# Patient Record
Sex: Female | Born: 1935 | Race: White | Hispanic: No | Marital: Married | Smoking: Never smoker
Health system: Southern US, Community
[De-identification: ages and names within clinical notes are randomized; demographics above are authoritative.]

## PROBLEM LIST (undated history)

## (undated) DIAGNOSIS — M47812 Spondylosis without myelopathy or radiculopathy, cervical region: Secondary | ICD-10-CM

## (undated) DIAGNOSIS — I351 Nonrheumatic aortic (valve) insufficiency: Secondary | ICD-10-CM

## (undated) DIAGNOSIS — E559 Vitamin D deficiency, unspecified: Secondary | ICD-10-CM

## (undated) DIAGNOSIS — M8580 Other specified disorders of bone density and structure, unspecified site: Secondary | ICD-10-CM

## (undated) DIAGNOSIS — I371 Nonrheumatic pulmonary valve insufficiency: Secondary | ICD-10-CM

## (undated) DIAGNOSIS — I5189 Other ill-defined heart diseases: Secondary | ICD-10-CM

## (undated) DIAGNOSIS — I00 Rheumatic fever without heart involvement: Secondary | ICD-10-CM

## (undated) DIAGNOSIS — I370 Nonrheumatic pulmonary valve stenosis: Secondary | ICD-10-CM

## (undated) HISTORY — DX: Spondylosis without myelopathy or radiculopathy, cervical region: M47.812

## (undated) HISTORY — DX: Other specified disorders of bone density and structure, unspecified site: M85.80

## (undated) HISTORY — DX: Other ill-defined heart diseases: I51.89

## (undated) HISTORY — DX: Nonrheumatic aortic (valve) insufficiency: I35.1

## (undated) HISTORY — DX: Vitamin D deficiency, unspecified: E55.9

## (undated) HISTORY — DX: Nonrheumatic pulmonary valve insufficiency: I37.1

## (undated) HISTORY — DX: Nonrheumatic pulmonary valve stenosis: I37.0

## (undated) HISTORY — PX: BREAST EXCISIONAL BIOPSY: SUR124

## (undated) HISTORY — DX: Rheumatic fever without heart involvement: I00

## (undated) HISTORY — PX: PARTIAL HYSTERECTOMY: SHX80

## (undated) HISTORY — PX: BREAST BIOPSY: SHX20

---

## 1998-11-05 ENCOUNTER — Encounter: Payer: Self-pay | Admitting: Endocrinology

## 1998-11-05 ENCOUNTER — Ambulatory Visit (HOSPITAL_COMMUNITY): Admission: RE | Admit: 1998-11-05 | Discharge: 1998-11-05 | Payer: Self-pay | Admitting: Endocrinology

## 1999-02-23 ENCOUNTER — Ambulatory Visit (HOSPITAL_COMMUNITY): Admission: RE | Admit: 1999-02-23 | Discharge: 1999-02-23 | Payer: Self-pay | Admitting: Endocrinology

## 2000-01-12 ENCOUNTER — Other Ambulatory Visit: Admission: RE | Admit: 2000-01-12 | Discharge: 2000-01-12 | Payer: Self-pay | Admitting: Family Medicine

## 2000-03-26 ENCOUNTER — Encounter: Admission: RE | Admit: 2000-03-26 | Discharge: 2000-03-26 | Payer: Self-pay | Admitting: Family Medicine

## 2000-03-26 ENCOUNTER — Encounter: Payer: Self-pay | Admitting: Family Medicine

## 2000-04-03 ENCOUNTER — Encounter: Admission: RE | Admit: 2000-04-03 | Discharge: 2000-04-03 | Payer: Self-pay | Admitting: Family Medicine

## 2000-04-03 ENCOUNTER — Encounter: Payer: Self-pay | Admitting: Family Medicine

## 2001-07-03 ENCOUNTER — Encounter: Admission: RE | Admit: 2001-07-03 | Discharge: 2001-07-03 | Payer: Self-pay | Admitting: Family Medicine

## 2001-07-03 ENCOUNTER — Encounter: Payer: Self-pay | Admitting: Family Medicine

## 2003-02-23 ENCOUNTER — Encounter: Payer: Self-pay | Admitting: Emergency Medicine

## 2003-02-23 ENCOUNTER — Observation Stay (HOSPITAL_COMMUNITY): Admission: EM | Admit: 2003-02-23 | Discharge: 2003-02-23 | Payer: Self-pay | Admitting: Emergency Medicine

## 2003-04-16 ENCOUNTER — Encounter: Admission: RE | Admit: 2003-04-16 | Discharge: 2003-04-16 | Payer: Self-pay | Admitting: Family Medicine

## 2003-04-16 ENCOUNTER — Encounter: Payer: Self-pay | Admitting: Family Medicine

## 2003-05-08 ENCOUNTER — Ambulatory Visit (HOSPITAL_COMMUNITY): Admission: RE | Admit: 2003-05-08 | Discharge: 2003-05-08 | Payer: Self-pay | Admitting: Gastroenterology

## 2003-05-08 ENCOUNTER — Encounter (INDEPENDENT_AMBULATORY_CARE_PROVIDER_SITE_OTHER): Payer: Self-pay | Admitting: Specialist

## 2003-11-02 ENCOUNTER — Ambulatory Visit (HOSPITAL_COMMUNITY): Admission: RE | Admit: 2003-11-02 | Discharge: 2003-11-02 | Payer: Self-pay | Admitting: Gastroenterology

## 2003-11-02 ENCOUNTER — Encounter (INDEPENDENT_AMBULATORY_CARE_PROVIDER_SITE_OTHER): Payer: Self-pay | Admitting: *Deleted

## 2003-11-16 ENCOUNTER — Encounter: Admission: RE | Admit: 2003-11-16 | Discharge: 2003-11-16 | Payer: Self-pay | Admitting: Gastroenterology

## 2004-04-21 ENCOUNTER — Encounter: Admission: RE | Admit: 2004-04-21 | Discharge: 2004-04-21 | Payer: Self-pay | Admitting: Family Medicine

## 2004-07-06 ENCOUNTER — Ambulatory Visit (HOSPITAL_COMMUNITY): Admission: RE | Admit: 2004-07-06 | Discharge: 2004-07-06 | Payer: Self-pay | Admitting: Gastroenterology

## 2004-07-06 ENCOUNTER — Encounter (INDEPENDENT_AMBULATORY_CARE_PROVIDER_SITE_OTHER): Payer: Self-pay | Admitting: *Deleted

## 2005-05-26 ENCOUNTER — Encounter: Admission: RE | Admit: 2005-05-26 | Discharge: 2005-05-26 | Payer: Self-pay | Admitting: Family Medicine

## 2006-06-13 ENCOUNTER — Encounter: Payer: Self-pay | Admitting: Vascular Surgery

## 2006-06-13 ENCOUNTER — Ambulatory Visit (HOSPITAL_COMMUNITY): Admission: RE | Admit: 2006-06-13 | Discharge: 2006-06-13 | Payer: Self-pay | Admitting: Cardiology

## 2006-06-29 ENCOUNTER — Encounter: Admission: RE | Admit: 2006-06-29 | Discharge: 2006-06-29 | Payer: Self-pay | Admitting: Family Medicine

## 2007-07-09 ENCOUNTER — Encounter: Admission: RE | Admit: 2007-07-09 | Discharge: 2007-07-09 | Payer: Self-pay | Admitting: Family Medicine

## 2007-11-28 HISTORY — PX: LAPAROSCOPIC CHOLECYSTECTOMY: SUR755

## 2008-05-14 ENCOUNTER — Encounter: Admission: RE | Admit: 2008-05-14 | Discharge: 2008-05-14 | Payer: Self-pay | Admitting: Family Medicine

## 2008-07-10 ENCOUNTER — Encounter: Admission: RE | Admit: 2008-07-10 | Discharge: 2008-07-10 | Payer: Self-pay | Admitting: Family Medicine

## 2008-09-17 ENCOUNTER — Ambulatory Visit (HOSPITAL_COMMUNITY): Admission: RE | Admit: 2008-09-17 | Discharge: 2008-09-19 | Payer: Self-pay | Admitting: Surgery

## 2008-09-17 ENCOUNTER — Encounter (INDEPENDENT_AMBULATORY_CARE_PROVIDER_SITE_OTHER): Payer: Self-pay | Admitting: Surgery

## 2008-10-16 ENCOUNTER — Encounter: Admission: RE | Admit: 2008-10-16 | Discharge: 2008-10-16 | Payer: Self-pay | Admitting: Family Medicine

## 2010-09-16 ENCOUNTER — Encounter: Admission: RE | Admit: 2010-09-16 | Discharge: 2010-09-16 | Payer: Self-pay | Admitting: Family Medicine

## 2011-04-11 NOTE — Op Note (Signed)
NAME:  Carrie Spencer, Carrie Spencer NO.:  [id number]   MEDICAL RECORD NO.:  [id number]          PATIENT TYPE:  INP   LOCATION:  0004                         FACILITY:  [facility]   PHYSICIAN:  Wilmon Arms. Corliss Skains, M.D. DATE OF BIRTH:  October 10, 1934   DATE OF PROCEDURE:  09/17/2008  DATE OF DISCHARGE:                               OPERATIVE REPORT   PREOPERATIVE DIAGNOSIS:  Symptomatic cholelithiasis.   POSTOPERATIVE DIAGNOSIS:  Symptomatic cholelithiasis.   PROCEDURE PERFORMED:  Laparoscopic cholecystectomy with intraoperative  cholangiogram.   SURGEON:  Wilmon Arms. Corliss Skains, M.D., FACS   ASSISTANT:  Velora Heckler, MD.   ANESTHESIA:  General endotracheal.   INDICATIONS:  The patient is a healthy 77 year old female who presents  with a 45-month history of intermittent right upper quadrant abdominal  pain with some bloating.  A CT scan obtained by her primary care  physician showed a moderate-size hiatal hernia with signs of  cholelithiasis but no cholecystitis.  She was seen in consultation.  We  recommended elective cholecystectomy.  The patient presents today for  cholecystectomy.   DESCRIPTION OF PROCEDURE:  The patient was brought to the operating  room, placed in a supine position on the operating room table.  After an  adequate level of general anesthesia was obtained, the patient's abdomen  prepped with Betadine and draped in a sterile fashion.  She had a  preexisting lower midline incision.  We opened the top end of her  incision for about a centimeter.  Dissection was carried down to the  fascia, which was opened vertically.  We entered the peritoneal cavity  bluntly.  A stay suture of 0 Vicryl was placed around the fascial  opening.  Pneumoperitoneum was  obtained by insufflating CO2,  maintaining a maximal pressure of 15 mmHg.  The laparoscope was  inserted.  The patient was positioned in reverse Trendelenburg and  tilted to her left.  The patient had a very large  nondistended  gallbladder.  An 11-mm port was placed in the subxiphoid position.  Two  5-mm ports were placed in the right upper quadrant.  The gallbladder was  grasped with a clamp and elevated.  There were adhesions to the surface  of the gallbladder.  These were taken down bluntly.  We opened the  peritoneum around the hilum of the gallbladder.  We identified 2 tubular  structures entering the gallbladder.  The more anterior structure was  ligated with clips and divided.  This represented the cystic artery.  The cystic duct was ligated with a clip and a small opening was created  on the cystic duct.  A Cook cholangiogram catheter was inserted through  the stab incision and threaded into the cystic duct.  A cholangiogram  was obtained, which showed an adequate length of cystic duct.  Contrast  flowed easily into the biliary tree with no sign of filling defects or  obstruction.  Contrast flowed easily into the duodenum.  The catheter  was removed and the cystic duct ligated with clips and divided.  Cautery  was then used to remove the gallbladder from the liver.  The gallbladder  was irrigated and hemostasis was good.  We placed the gallbladder into  an EndoCatch sack and removed it through the umbilical port site.  We  reinspected the right upper quadrant and hemostasis was good.  Pneumoperitoneum was then released as we removed the ports under direct  vision.  The pursestring suture was used to close the umbilical fascia  and 4-0 Monocryl was used to close the skin incisions.  Steri-Strips and  clean dressings were applied.  The patient was then extubated and  brought to recovery in stable condition.  All sponge, instrument, and  needle counts were correct.      Wilmon Arms. Tsuei, M.D.  Electronically Signed     MKT/MEDQ  D:  09/17/2008  T:  09/18/2008  Job:  [id number]   cc:   Clarene Duke, MD

## 2011-04-14 NOTE — Op Note (Signed)
NAME:  Carrie Spencer, Carrie Spencer                        ACCOUNT NO.:  [id number]   MEDICAL RECORD NO.:  [id number]                   PATIENT TYPE:  AMB   LOCATION:  ENDO                                 FACILITY:  [facility]   PHYSICIAN:  Anselmo Rod, M.D.               DATE OF BIRTH:  1934-08-05   DATE OF PROCEDURE:  07/06/2004  DATE OF DISCHARGE:                                 OPERATIVE REPORT   PROCEDURE PERFORMED:  Flexible sigmoidoscopy with multiple biopsies.   ENDOSCOPIST:  Anselmo Rod, M.D.   INSTRUMENT USED:  Olympus video colonoscope.   INDICATIONS FOR PROCEDURE:  The patient is a 77 year old white female with  abnormality noted on the rectosigmoid colon in December 2004 diagnosed to be  mucosal prolapse undergoing a repeat evaluation of that area by flexible  sigmoidoscopy for possible repeat biopsy to rule out dysplasia versus  malignancy.   PREPROCEDURE PREPARATION:  Informed consent was procured from the patient.  The patient was fasted for eight hours prior to the procedure and prepped  with a bottle of magnesium citrate and a gallon of GoLYTELY the night prior  to the procedure.   PREPROCEDURE PHYSICAL:  The patient had stable vital signs.  Neck supple.  Chest clear to auscultation.  S1, S2 regular. Abdomen soft with normal bowel  sounds.   DESCRIPTION OF PROCEDURE:  The patient was placed in left lateral decubitus  position and sedated with 75 mg of Demerol and 7.5 mg of Versed in slow  incremental doses.  Once the patient was adequately sedated and maintained  on low flow oxygen and continuous cardiac monitoring, the Olympus video  colonoscope was advanced from the rectum to 90 cm without difficulty.  There  was some residual stool in the colon.  Multiple washes were done.  Nodular  changes were noted again in the rectosigmoid colon from 10 to 20 cm.  These  were scattered with some areas of normal intervening mucosa.  There was  ulceration noted at about 15 cm.   Multiple biopsies were done to rule out  dysplasia versus malignancy.  Small internal hemorrhoids seen on  retroflexion in the rectum.  No other abnormalities noted after 90 cm.   RECOMMENDATIONS:  1. Await pathology results.  2. Follow high fiber diet with strict bowel regimen to prevent problems with     chronic constipation and further irritation of this area.  3. Outpatient followup in the next two weeks for further recommendations.                                               Anselmo Rod, M.D.    JNM/MEDQ  D:  07/06/2004  T:  07/06/2004  Job:  [id number]   cc:   Vikki Ports, M.D.  [phone]  [address]  [place]  [place] [address]  Fax: [phone]

## 2011-04-14 NOTE — Op Note (Signed)
NAME:  Carrie Spencer, Carrie Spencer                        ACCOUNT NO.:  [id number]   MEDICAL RECORD NO.:  [id number]                   PATIENT TYPE:  AMB   LOCATION:  ENDO                                 FACILITY:  [facility]   PHYSICIAN:  Anselmo Rod, M.D.               DATE OF BIRTH:  1933-12-05   DATE OF PROCEDURE:  05/08/2003  DATE OF DISCHARGE:  05/08/2003                                 OPERATIVE REPORT   PROCEDURE:  Colonoscopy with biopsies.   ENDOSCOPIST:  Anselmo Rod, M.D.   INSTRUMENT USED:  Olympus video colonoscope.   INDICATIONS FOR PROCEDURE:  Guaiac positive stools in a 77 year old white  female.  Rule out colonic polyps or masses.  The patient has a history of  abnormal folds in the rectum and rectosigmoid area noted on colonoscopy in  the past.   PRE-PROCEDURE PREPARATION:  Informed consent was procured from the patient.  The patient was fasted for eight hours prior to the procedure and prepped  with a bottle of magnesium citrate and a gallon of GoLYTELY the night prior  to the procedure.   PRE-PROCEDURE PHYSICAL:  VITAL SIGNS:  The patient had stable vital signs.  NECK:  Supple.  CHEST:  Clear to auscultation.  CARDIAC:  S1 and S2 are regular.  ABDOMEN:  Abdomen is soft with normal bowel sounds.   DESCRIPTION OF PROCEDURE:  The patient was placed in the left lateral  decubitus position and sedated with 50 mg of Demerol and 5 mg of Versed  intravenously.  Once the patient was adequately sedated and maintained on  low flow oxygen and continuous cardiac monitoring, the Olympus video  colonoscope was advanced from the rectum to the cecum.  There was a  significant amount of residual stool in the colon and multiple washings were  done.  Nodular changes were noted in the mucosa from 10 to 15 cm.  This was  biopsied and sent to pathology.  Four seemed to be soft to biopsy.  No  definite mass or polyps were seen. __________ changes were appreciated on a  colonoscopy in  the past.  No AVMs or masses were noted elsewhere.  The  appendiceal orifice and inferior vena cava were visualized and photographed.  There was evidence of significant melanosis colic in the right colon.  The  patient tolerated the procedure well without complications.  Small internal  and external hemorrhoids were seen on retroflexion and anal inspection,  respectively.   IMPRESSION:  1. Small non-bleeding internal and external hemorrhoids.  2. Nodular mucosa at 10 to 15 cm, biopsies done.  3. 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    RECOMMENDATIONS:  1. Await pathology results.  2. Avoid all non-steroidals for now.  3. The patient will follow-up in the next two weeks for further     recommendations.  Anselmo Rod, M.D.    JNM/MEDQ  D:  05/10/2003  T:  05/10/2003  Job:  [id number]   cc:   Vikki Ports, M.D.  [address]  [place]  [place] [address]  Fax: [phone]

## 2011-04-14 NOTE — Op Note (Signed)
NAME:  Carrie Spencer, Carrie Spencer                        ACCOUNT NO.:  [id number]   MEDICAL RECORD NO.:  [id number]                   PATIENT TYPE:  AMB   LOCATION:  ENDO                                 FACILITY:  [facility]   PHYSICIAN:  Anselmo Rod, M.D.               DATE OF BIRTH:  [birth year]   DATE OF PROCEDURE:  11/02/2003  DATE OF DISCHARGE:                                 OPERATIVE REPORT   PROCEDURE:  Flexible sigmoidoscopy with biopsies.   ENDOSCOPIST:  Anselmo Rod, M.D.   INSTRUMENT USED:  Olympus video colonoscope.   INDICATIONS FOR PROCEDURE:  A 77 year old white female  with a history of  abnormal mass in the rectosigmoid area, who is undergoing a repeat  flexible  sigmoidoscopy to reevaluate  this area. Biopsies showed no evidence of  adenoma or dysplasia.   PREPROCEDURE PREPARATION:  Informed consent was obtained from the patient  and the patient was  fasted for 8 hours prior to the procedure and prepped  with a bottle of  MiraLax the  night prior to the procedure.   PREPROCEDURE PHYSICAL:  The patient had stable vital signs. Neck supple.  Chest clear to auscultation, S1, S2 regular. Abdomen soft with normoactive  bowel sounds.   DESCRIPTION OF PROCEDURE:  The patient was placed in the left lateral  decubitus position and sedated with 40 mg of Demerol and 4 mg of Versed  intravenously. Once sedation was adequate, the patient was maintained on low  flow oxygen and continuous cardiac monitoring.   The Olympus video colonoscope was advanced from the rectum to 50 cm. Nodular  changes were seen in the rectosigmoid mucosa from 10 to 15 cm. The rectal  mucosa appeared healthy and so did the mucosa beyond 15 cm up to 50 cm.  Retroflexion in the rectum revealed small nonbleeding internal hemorrhoids.  Multiple biopsies were done from the rectosigmoid area. There was no  evidence of diverticulosis in the sigmoid colon.   IMPRESSION:  1. Nodular changes in the rectosigmoid  colon of unclear significance.     Biopsies done.  2. Small internal hemorrhoids.  3. Normal appearing rectal mucosa and normal appearing sigmoid colon from 16     to 50 cm.   RECOMMENDATIONS:  1. Await pathology results.  2. Outpatient followup in the next 4 weeks or earlier if need be.                                               Anselmo Rod, M.D.    JNM/MEDQ  D:  11/02/2003  T:  11/02/2003  Job:  [id number]   cc:   Vikki Ports, M.D.  [address]  [place]  [place] [address]  Fax: [phone]

## 2011-08-28 LAB — CBC
HCT: 35.1 — ABNORMAL LOW
Hemoglobin: 11.8 — ABNORMAL LOW
MCHC: 33.7
MCV: 89.8
Platelets: 209
RBC: 3.91
RDW: 14.8
WBC: 4.5

## 2011-08-28 LAB — DIFFERENTIAL
Basophils Absolute: 0
Basophils Relative: 0
Eosinophils Absolute: 0.1
Eosinophils Relative: 3
Lymphocytes Relative: 20
Lymphs Abs: 0.9
Monocytes Absolute: 0.3
Monocytes Relative: 8
Neutro Abs: 3.1
Neutrophils Relative %: 69

## 2011-08-28 LAB — COMPREHENSIVE METABOLIC PANEL
ALT: 13
AST: 16
Albumin: 3.4 — ABNORMAL LOW
Alkaline Phosphatase: 50
BUN: 7
CO2: 27
Calcium: 8.9
Chloride: 109
Creatinine, Ser: 1.02
GFR calc Af Amer: >60
GFR calc non Af Amer: 53 — ABNORMAL LOW
Glucose, Bld: 86
Potassium: 3.3 — ABNORMAL LOW
Sodium: 142
Total Bilirubin: 0.6
Total Protein: 5.8 — ABNORMAL LOW

## 2011-10-03 ENCOUNTER — Other Ambulatory Visit: Payer: Self-pay | Admitting: Family Medicine

## 2011-10-03 DIAGNOSIS — Z1231 Encounter for screening mammogram for malignant neoplasm of breast: Secondary | ICD-10-CM

## 2011-10-20 ENCOUNTER — Ambulatory Visit: Payer: Self-pay

## 2011-10-20 ENCOUNTER — Ambulatory Visit
Admission: RE | Admit: 2011-10-20 | Discharge: 2011-10-20 | Disposition: A | Discharge status: Home / Self Care | Payer: Medicare Other | Source: Ambulatory Visit | Attending: Family Medicine | Admitting: Family Medicine

## 2011-10-20 DIAGNOSIS — Z1231 Encounter for screening mammogram for malignant neoplasm of breast: Secondary | ICD-10-CM

## 2012-07-16 ENCOUNTER — Other Ambulatory Visit: Payer: Self-pay | Admitting: Dermatology

## 2012-11-13 ENCOUNTER — Other Ambulatory Visit: Payer: Self-pay | Admitting: Family Medicine

## 2012-11-13 DIAGNOSIS — M899 Disorder of bone, unspecified: Secondary | ICD-10-CM

## 2012-11-13 DIAGNOSIS — Z1231 Encounter for screening mammogram for malignant neoplasm of breast: Secondary | ICD-10-CM

## 2012-11-13 DIAGNOSIS — M949 Disorder of cartilage, unspecified: Secondary | ICD-10-CM

## 2012-12-20 ENCOUNTER — Ambulatory Visit
Admission: RE | Admit: 2012-12-20 | Discharge: 2012-12-20 | Disposition: A | Discharge status: Home / Self Care | Payer: Medicare Other | Source: Ambulatory Visit | Attending: Family Medicine | Admitting: Family Medicine

## 2012-12-20 DIAGNOSIS — Z1231 Encounter for screening mammogram for malignant neoplasm of breast: Secondary | ICD-10-CM

## 2012-12-20 DIAGNOSIS — M899 Disorder of bone, unspecified: Secondary | ICD-10-CM

## 2014-04-07 ENCOUNTER — Other Ambulatory Visit: Payer: Self-pay

## 2014-04-07 DIAGNOSIS — Z1231 Encounter for screening mammogram for malignant neoplasm of breast: Secondary | ICD-10-CM

## 2014-05-13 ENCOUNTER — Ambulatory Visit
Admission: RE | Admit: 2014-05-13 | Discharge: 2014-05-13 | Disposition: A | Discharge status: Home / Self Care | Payer: Medicare Other | Source: Ambulatory Visit

## 2014-05-13 DIAGNOSIS — Z1231 Encounter for screening mammogram for malignant neoplasm of breast: Secondary | ICD-10-CM

## 2014-12-29 ENCOUNTER — Encounter (HOSPITAL_COMMUNITY): Payer: Self-pay | Admitting: Emergency Medicine

## 2014-12-29 ENCOUNTER — Emergency Department (HOSPITAL_COMMUNITY)
Admission: EM | Admit: 2014-12-29 | Discharge: 2014-12-29 | Disposition: A | Discharge status: Home / Self Care | Payer: Medicare Other | Attending: Emergency Medicine | Admitting: Emergency Medicine

## 2014-12-29 DIAGNOSIS — E860 Dehydration: Secondary | ICD-10-CM | POA: Diagnosis present

## 2014-12-29 DIAGNOSIS — Z79899 Other long term (current) drug therapy: Secondary | ICD-10-CM | POA: Diagnosis not present

## 2014-12-29 DIAGNOSIS — M47812 Spondylosis without myelopathy or radiculopathy, cervical region: Secondary | ICD-10-CM | POA: Insufficient documentation

## 2014-12-29 DIAGNOSIS — Z8679 Personal history of other diseases of the circulatory system: Secondary | ICD-10-CM | POA: Insufficient documentation

## 2014-12-29 DIAGNOSIS — R000 Tachycardia, unspecified: Secondary | ICD-10-CM | POA: Diagnosis not present

## 2014-12-29 DIAGNOSIS — Z880 Allergy status to penicillin: Secondary | ICD-10-CM | POA: Insufficient documentation

## 2014-12-29 DIAGNOSIS — E559 Vitamin D deficiency, unspecified: Secondary | ICD-10-CM | POA: Insufficient documentation

## 2014-12-29 DIAGNOSIS — R011 Cardiac murmur, unspecified: Secondary | ICD-10-CM | POA: Diagnosis not present

## 2014-12-29 DIAGNOSIS — M8580 Other specified disorders of bone density and structure, unspecified site: Secondary | ICD-10-CM | POA: Insufficient documentation

## 2014-12-29 LAB — COMPREHENSIVE METABOLIC PANEL
ALT: 14 U/L (ref 0–35)
AST: 23 U/L (ref 0–37)
Albumin: 4.1 g/dL (ref 3.5–5.2)
Alkaline Phosphatase: 65 U/L (ref 39–117)
Anion gap: 9 (ref 5–15)
BUN: 19 mg/dL (ref 6–23)
CO2: 22 mmol/L (ref 19–32)
Calcium: 8.4 mg/dL (ref 8.4–10.5)
Chloride: 109 mmol/L (ref 96–112)
Creatinine, Ser: 0.91 mg/dL (ref 0.50–1.10)
GFR calc Af Amer: 68 mL/min — ABNORMAL LOW (ref >90)
GFR calc non Af Amer: 59 mL/min — ABNORMAL LOW (ref >90)
Glucose, Bld: 120 mg/dL — ABNORMAL HIGH (ref 70–99)
Potassium: 4 mmol/L (ref 3.5–5.1)
Sodium: 140 mmol/L (ref 135–145)
Total Bilirubin: 0.7 mg/dL (ref 0.3–1.2)
Total Protein: 7.2 g/dL (ref 6.0–8.3)

## 2014-12-29 LAB — CBC WITH DIFFERENTIAL/PLATELET
Basophils Absolute: 0 10*3/uL (ref 0.0–0.1)
Basophils Relative: 0 % (ref 0–1)
Eosinophils Absolute: 0 10*3/uL (ref 0.0–0.7)
Eosinophils Relative: 0 % (ref 0–5)
HCT: 43.2 % (ref 36.0–46.0)
Hemoglobin: 14.3 g/dL (ref 12.0–15.0)
Lymphocytes Relative: 9 % — ABNORMAL LOW (ref 12–46)
Lymphs Abs: 0.6 10*3/uL — ABNORMAL LOW (ref 0.7–4.0)
MCH: 29.6 pg (ref 26.0–34.0)
MCHC: 33.1 g/dL (ref 30.0–36.0)
MCV: 89.4 fL (ref 78.0–100.0)
Monocytes Absolute: 0.3 10*3/uL (ref 0.1–1.0)
Monocytes Relative: 4 % (ref 3–12)
Neutro Abs: 6.4 10*3/uL (ref 1.7–7.7)
Neutrophils Relative %: 87 % — ABNORMAL HIGH (ref 43–77)
Platelets: 234 10*3/uL (ref 150–400)
RBC: 4.83 MIL/uL (ref 3.87–5.11)
RDW: 14 % (ref 11.5–15.5)
WBC: 7.3 10*3/uL (ref 4.0–10.5)

## 2014-12-29 LAB — URINALYSIS, ROUTINE W REFLEX MICROSCOPIC
Glucose, UA: NEGATIVE mg/dL
Hgb urine dipstick: NEGATIVE
Ketones, ur: 15 mg/dL — AB
Leukocytes, UA: NEGATIVE
Nitrite: NEGATIVE
Protein, ur: NEGATIVE mg/dL
Specific Gravity, Urine: 1.024 (ref 1.005–1.030)
Urobilinogen, UA: 0.2 mg/dL (ref 0.0–1.0)
pH: 5.5 (ref 5.0–8.0)

## 2014-12-29 MED ORDER — SODIUM CHLORIDE 0.9 % IV SOLN
1000.0000 mL | Freq: Once | INTRAVENOUS | Status: AC
Start: 2014-12-29 — End: 2014-12-29
  Administered 2014-12-29: 1000 mL via INTRAVENOUS

## 2014-12-29 MED ORDER — ONDANSETRON 4 MG PO TBDP: ORAL_TABLET | ORAL | Status: DC

## 2014-12-29 MED ORDER — ONDANSETRON HCL 4 MG/2ML IJ SOLN
4.0000 mg | Freq: Once | INTRAMUSCULAR | Status: AC
  Administered 2014-12-29: 4 mg via INTRAVENOUS
  Filled 2014-12-29: qty 2

## 2014-12-29 MED ORDER — SODIUM CHLORIDE 0.9 % IV BOLUS (SEPSIS)
500.0000 mL | Freq: Once | INTRAVENOUS | Status: AC
  Administered 2014-12-29: 500 mL via INTRAVENOUS

## 2014-12-29 NOTE — ED Provider Notes (Signed)
Patient presented to the ER with dehydration. Patient reports nausea, vomiting, diarrhea since last night. She saw her doctor earlier felt she was dehydrated, sent to the ER for fluids. She is not experiencing any abdominal pain currently.  Face to face Exam: HEENT - PERRLA Lungs - CTAB Heart - Reg tachycardia, no M/R/G Abd - S/NT/ND Neuro - alert, oriented x3  Plan: Check basic labs, Mister IV fluids. If vital signs (heart rate) improve and labs are normal, appropriate for discharge. If significant dehydration, admitted to hospital for further hydration.   Orpah Greek, MD 12/29/14 1739

## 2014-12-29 NOTE — ED Notes (Signed)
Placed pt on bedpan in attempt to collect urine sample.

## 2014-12-29 NOTE — ED Provider Notes (Signed)
CSN: [id number]     Arrival date & time 12/29/14  1554 History   First MD Initiated Contact with Patient 12/29/14 1623     Chief Complaint  Patient presents with  . Dehydration  . Tachycardia     (Consider location/radiation/quality/duration/timing/severity/associated sxs/prior Treatment) HPI Carrie Spencer is a 81 y.o. female who presents from her primary care, [facility] and Dr. Jacelyn Grip for evaluation of tachycardia and dehydration. Patient reports last night at approximately 11 PM she began to feel nauseous and began to vomit at around 2:30 AM this morning. She also reports associated diarrhea with 6 or 7 episodes of loose watery stool, without blood. She also reports approximately 6 episodes of vomiting, mostly dry heaving, nonbilious nonbloody. She reports taking Pepto-Bismol without relief. She denies fevers at home, headaches, chest pain, shortness of breath, cough, leg swelling, orthopnea. No changes in vision, numbness or weakness or syncope.  Past Medical History  Diagnosis Date  . Rheumatic fever   . Pulmonic stenosis   . Diastolic dysfunction   . Osteopenia   . DJD (degenerative joint disease) of cervical spine   . Vitamin D deficiency    Past Surgical History  Procedure Laterality Date  . Partial hysterectomy    . Breast biopsy    . Laparoscopic cholecystectomy  2009   Family History  Problem Relation Age of Onset  . Emphysema Mother   . COPD Mother   . Heart failure Father   . CVA Father   . Pneumonia Sister    History  Substance Use Topics  . Smoking status: Never Smoker   . Smokeless tobacco: Not on file  . Alcohol Use: Not on file   OB History    No data available     Review of Systems  All other systems reviewed and are negative.   A 10 point review of systems was completed and was negative except for pertinent positives and negatives as mentioned in the history of present illness    Allergies  Codeine and Penicillins  Home Medications    Prior to Admission medications   Medication Sig Start Date End Date Taking? Authorizing Provider  bismuth subsalicylate (PEPTO BISMOL) 262 MG/15ML suspension Take 30 mLs by mouth every 6 (six) hours as needed for indigestion (indigestion).   Yes Historical Provider, MD  Cholecalciferol (VITAMIN D3 PO) Take 1 tablet by mouth daily.   Yes Historical Provider, MD  omeprazole (PRILOSEC) 10 MG capsule Take 10 mg by mouth daily.    Yes Historical Provider, MD  ibuprofen (ADVIL,MOTRIN) 200 MG tablet Take 200 mg by mouth every 6 (six) hours as needed.    Historical Provider, MD  ondansetron (ZOFRAN ODT) 4 MG disintegrating tablet 4mg  ODT q4 hours prn nausea/vomit 12/29/14   Viona Gilmore Cartner, PA-C   BP 121/52 mmHg  Pulse 106  Temp(Src) 97.6 F (36.4 C) (Oral)  Resp 20  SpO2 99% Physical Exam  Constitutional: She is oriented to person, place, and time. She appears well-developed and well-nourished.  Pale in appearance.  HENT:  Head: Normocephalic and atraumatic.  Mildly dry mucous membranes  Eyes: Conjunctivae are normal. Pupils are equal, round, and reactive to light. Right eye exhibits no discharge. Left eye exhibits no discharge. No scleral icterus.  Neck: Normal range of motion. Neck supple. No JVD present.  Cardiovascular: Normal rate and regular rhythm.   Small 2/6 systolic murmur  Pulmonary/Chest: Effort normal and breath sounds normal. No stridor. No respiratory distress. She has no wheezes. She  has no rales.  Abdominal: Soft. She exhibits no distension and no mass. There is no tenderness. There is no rebound and no guarding.  Musculoskeletal: Normal range of motion. She exhibits no edema or tenderness.  Neurological: She is alert and oriented to person, place, and time.  Cranial Nerves II-XII grossly intact  Skin: Skin is warm and dry. No rash noted.  Psychiatric: She has a normal mood and affect.  Nursing note and vitals reviewed.   ED Course  Procedures (including critical  care time) Labs Review Labs Reviewed  CBC WITH DIFFERENTIAL/PLATELET - Abnormal; Notable for the following:    Neutrophils Relative % 87 (*)    Lymphocytes Relative 9 (*)    Lymphs Abs 0.6 (*)    All other components within normal limits  COMPREHENSIVE METABOLIC PANEL - Abnormal; Notable for the following:    Glucose, Bld 120 (*)    GFR calc non Af Amer 59 (*)    GFR calc Af Amer 68 (*)    All other components within normal limits  URINALYSIS, ROUTINE W REFLEX MICROSCOPIC - Abnormal; Notable for the following:    APPearance CLOUDY (*)    Bilirubin Urine SMALL (*)    Ketones, ur 15 (*)    All other components within normal limits    Imaging Review No results found.   EKG Interpretation   Date/Time:  Tuesday December 29 2014 16:45:17 EST Ventricular Rate:  116 PR Interval:  129 QRS Duration: 88 QT Interval:  335 QTC Calculation: 465 R Axis:   67 Text Interpretation:  Sinus tachycardia Otherwise within normal limits  Confirmed by POLLINA  MD, CHRISTOPHER [phone]) on 12/29/2014 4:47:31 PM     Meds given in ED:  Medications  0.9 %  sodium chloride infusion (0 mLs Intravenous Stopped 12/29/14 1836)  ondansetron (ZOFRAN) injection 4 mg (4 mg Intravenous Given 12/29/14 1720)  sodium chloride 0.9 % bolus 500 mL (0 mLs Intravenous Stopped 12/29/14 1925)    Discharge Medication List as of 12/29/2014  6:48 PM    START taking these medications   Details  ondansetron (ZOFRAN ODT) 4 MG disintegrating tablet 4mg  ODT q4 hours prn nausea/vomit, Print       Filed Vitals:   12/29/14 1604 12/29/14 1730 12/29/14 1814 12/29/14 1927  BP: 117/64 114/67 127/64 121/52  Pulse: 131 102 95 106  Temp: 97.6 F (36.4 C)     TempSrc: Oral     Resp: 16 15 12 20   SpO2: 100%  100% 99%    MDM  Vitals stable - WNL -afebrile, tachycardia resolving. Pt resting comfortably in ED. reports that IV fluids and anti-emetics helped her feel much better. She is drinking ginger ale and crackers in her room. No  vomiting or diarrhea in ED. PE--not concerning further acute or emergent pathology.  Labwork noncontributory, no evidence of UTI or other infection. No anemia  Low concern for other acute or emergent pathology at this time. Patient is asymptomatic other than tachycardia that is resolving in the ED. She denies any chest pain, shortness of breath or cough, no urinary symptoms. No fevers. Will DC with Zofran and instructions for appropriate rehydration. Encourage follow-up with her PCP I discussed all relevant lab findings and imaging results with pt and they verbalized understanding. Discussed f/u with PCP within 48 hrs and return precautions, pt very amenable to plan.  Prior to patient discharge, I discussed and reviewed this case with Dr.Pollina, who also saw and evaluated the pt.  Final diagnoses:  Dehydration  Tachycardia        Verl Dicker, PA-C 12/30/14 1212  Orpah Greek, MD 01/02/15 [phone]

## 2014-12-29 NOTE — Discharge Instructions (Signed)
Dehydration, Adult °Dehydration is when you lose more fluids from the body than you take in. Vital organs like the kidneys, brain, and heart cannot function without a proper amount of fluids and salt. Any loss of fluids from the body can cause dehydration.  °CAUSES  °· Vomiting. °· Diarrhea. °· Excessive sweating. °· Excessive urine output. °· Fever. °SYMPTOMS  °Mild dehydration °· Thirst. °· Dry lips. °· Slightly dry mouth. °Moderate dehydration °· Very dry mouth. °· Sunken eyes. °· Skin does not bounce back quickly when lightly pinched and released. °· Dark urine and decreased urine production. °· Decreased tear production. °· Headache. °Severe dehydration °· Very dry mouth. °· Extreme thirst. °· Rapid, weak pulse (more than 100 beats per minute at rest). °· Cold hands and feet. °· Not able to sweat in spite of heat and temperature. °· Rapid breathing. °· Blue lips. °· Confusion and lethargy. °· Difficulty being awakened. °· Minimal urine production. °· No tears. °DIAGNOSIS  °Your caregiver will diagnose dehydration based on your symptoms and your exam. Blood and urine tests will help confirm the diagnosis. The diagnostic evaluation should also identify the cause of dehydration. °TREATMENT  °Treatment of mild or moderate dehydration can often be done at home by increasing the amount of fluids that you drink. It is best to drink small amounts of fluid more often. Drinking too much at one time can make vomiting worse. Refer to the home care instructions below. °Severe dehydration needs to be treated at the hospital where you will probably be given intravenous (IV) fluids that contain water and electrolytes. °HOME CARE INSTRUCTIONS  °· Ask your caregiver about specific rehydration instructions. °· Drink enough fluids to keep your urine clear or pale yellow. °· Drink small amounts frequently if you have nausea and vomiting. °· Eat as you normally do. °· Avoid: °¨ Foods or drinks high in sugar. °¨ Carbonated  drinks. °¨ Juice. °¨ Extremely hot or cold fluids. °¨ Drinks with caffeine. °¨ Fatty, greasy foods. °¨ Alcohol. °¨ Tobacco. °¨ Overeating. °¨ Gelatin desserts. °· Wash your hands well to avoid spreading bacteria and viruses. °· Only take over-the-counter or prescription medicines for pain, discomfort, or fever as directed by your caregiver. °· Ask your caregiver if you should continue all prescribed and over-the-counter medicines. °· Keep all follow-up appointments with your caregiver. °SEEK MEDICAL CARE IF: °· You have abdominal pain and it increases or stays in one area (localizes). °· You have a rash, stiff neck, or severe headache. °· You are irritable, sleepy, or difficult to awaken. °· You are weak, dizzy, or extremely thirsty. °SEEK IMMEDIATE MEDICAL CARE IF:  °· You are unable to keep fluids down or you get worse despite treatment. °· You have frequent episodes of vomiting or diarrhea. °· You have blood or green matter (bile) in your vomit. °· You have blood in your stool or your stool looks black and tarry. °· You have not urinated in 6 to 8 hours, or you have only urinated a small amount of very dark urine. °· You have a fever. °· You faint. °MAKE SURE YOU:  °· Understand these instructions. °· Will watch your condition. °· Will get help right away if you are not doing well or get worse. °Document Released: 11/13/2005 Document Revised: 02/05/2012 Document Reviewed: 07/03/2011 °[employer]® Patient Information ©2015 [employer]. This information is not intended to replace advice given to you by your health care provider. Make sure you discuss any questions you have with your health care   provider.  Dehydration Dehydration is when you lose more fluids from the body than you take in. Vital organs such as the kidneys, brain, and heart cannot function without a proper amount of fluids and salt. Any loss of fluids from the body can cause dehydration.  Older adults are at a higher risk of dehydration than  younger adults. As we age, our bodies are less able to conserve water and do not respond to temperature changes as well. Also, older adults do not become thirsty as easily or quickly. Because of this, older adults often do not realize they need to increase fluids to avoid dehydration.  CAUSES   Vomiting.  Diarrhea.  Excessive sweating.  Excessive urination.  Fever.  Certain medicines, such as blood pressure medicines called diuretics.  Poorly controlled blood sugars. SIGNS AND SYMPTOMS  Mild dehydration:  Thirst.  Dry lips.  Slightly dry mouth. Moderate dehydration:  Very dry mouth.  Sunken eyes.  Skin does not bounce back quickly when lightly pinched and released.  Dark urine and decreased urine production.  Decreased tear production.  Headache. Severe dehydration:  Very dry mouth.  Extreme thirst.  Rapid, weak pulse (more than 100 beats per minute at rest).  Cold hands and feet.  Not able to sweat in spite of heat.  Rapid breathing.  Blue lips.  Confusion and lethargy.  Difficulty being awakened.  Minimal urine production.  No tears. DIAGNOSIS  Your health care provider will diagnose dehydration based on your symptoms and your exam. Blood and urine tests will help confirm the diagnosis. The diagnostic evaluation should also identify the cause of dehydration. TREATMENT  Treatment of mild or moderate dehydration can often be done at home by increasing the amount of fluids that you drink. It is best to drink small amounts of fluid more often. Drinking too much at one time can make vomiting worse. Severe dehydration needs to be treated at the hospital. You may be given IV fluids that contain water and electrolytes. HOME CARE INSTRUCTIONS   Ask your health care provider about specific rehydration instructions.  Drink enough fluids to keep your urine clear or pale yellow.  Drink small amounts frequently if you have nausea and vomiting.  Eat as you  normally do.  Avoid:  Foods or drinks high in sugar.  Carbonated drinks.  Juice.  Extremely hot or cold fluids.  Drinks with caffeine.  Fatty, greasy foods.  Alcohol.  Tobacco.  Overeating.  Gelatin desserts.  Wash your hands well to avoid spreading bacteria and viruses.  Only take over-the-counter or prescription medicines for pain, discomfort, or fever as directed by your health care provider.  Ask your health care provider if you should continue all prescribed and over-the-counter medicines.  Keep all follow-up appointments with your health care provider. SEEK MEDICAL CARE IF:  You have abdominal pain, and it increases or stays in one area (localizes).  You have a rash, stiff neck, or severe headache.  You are irritable, sleepy, or difficult to awaken.  You are weak, dizzy, or extremely thirsty.  You have a fever. SEEK IMMEDIATE MEDICAL CARE IF:   You are unable to keep fluids down, or you get worse despite treatment.  You have frequent episodes of vomiting or diarrhea.  You have blood or green matter (bile) in your vomit.  You have blood in your stool, or your stool looks black and tarry.  You have not urinated in 6-8 hours, or you have only urinated a small amount of very  dark urine.  You faint. MAKE SURE YOU:   Understand these instructions.  Will watch your condition.  Will get help right away if you are not doing well or get worse. Document Released: 02/03/2004 Document Revised: 11/18/2013 Document Reviewed: 07/21/2013 [facility] Patient Information 2015 [place], [place]. This information is not intended to replace advice given to you by your health care provider. Make sure you discuss any questions you have with your health care provider.  You were evaluated in the ED today for your fast heart rate. It appears that you were dehydrated. Her heart rate decreased after you received IV fluids. He reported feeling much better after this intervention.  It is important for you to follow-up with your primary care for further evaluation and management of your symptoms. Return to ED for new or worsening symptoms

## 2014-12-29 NOTE — ED Notes (Signed)
Pt c/o n/v onset last night, watery stools, seen by PCP who found pt to be dehydrated with HR 130, sent pt to ED.

## 2015-01-15 ENCOUNTER — Encounter: Payer: Self-pay | Admitting: Cardiology

## 2015-01-15 ENCOUNTER — Ambulatory Visit (INDEPENDENT_AMBULATORY_CARE_PROVIDER_SITE_OTHER): Payer: Medicare Other | Admitting: Cardiology

## 2015-01-15 VITALS — BP 162/86 | HR 85 | Ht 67.5 in | Wt 170.8 lb

## 2015-01-15 DIAGNOSIS — I10 Essential (primary) hypertension: Secondary | ICD-10-CM

## 2015-01-15 DIAGNOSIS — I00 Rheumatic fever without heart involvement: Secondary | ICD-10-CM | POA: Insufficient documentation

## 2015-01-15 DIAGNOSIS — I370 Nonrheumatic pulmonary valve stenosis: Secondary | ICD-10-CM | POA: Insufficient documentation

## 2015-01-15 HISTORY — DX: Rheumatic fever without heart involvement: I00

## 2015-01-15 HISTORY — DX: Nonrheumatic pulmonary valve stenosis: I37.0

## 2015-01-15 NOTE — Progress Notes (Signed)
Cardiology Office Note   Date:  01/15/2015   ID:  Carrie Spencer, DOB [birth year], MRN [id number]  PCP:  Gennette Pac, MD  Cardiologist:   Sueanne Margarita, MD   Chief Complaint  Patient presents with  . pulmonic stenosis  . Hypertension      History of Present Illness: Carrie Spencer is a 81 y.o. female who presents for evaluation of pulmonic stenosis.  She has a history of rheumatic fever, diastolic dysfunction and pulmonic stenosis and had been followed by Dr. Michaelle Birks at [facility] and is now here to establish care.  She has been doing well.  She says at night sometimes she feels her heart racing but does not notice it during the day.  She denies any chest pain or pressure.  Sometimes with strenuous activity such as running up stairs, she will get winded.  She denies any LE edema, dizziness or syncope.      Past Medical History  Diagnosis Date  . Rheumatic fever   . Pulmonic stenosis   . Diastolic dysfunction   . Osteopenia   . DJD (degenerative joint disease) of cervical spine   . Vitamin D deficiency   . Pulmonic stenosis 01/15/2015  . Rheumatic fever 01/15/2015    Past Surgical History  Procedure Laterality Date  . Partial hysterectomy    . Breast biopsy    . Laparoscopic cholecystectomy  2009     Current Outpatient Prescriptions  Medication Sig Dispense Refill  . bismuth subsalicylate (PEPTO BISMOL) 262 MG/15ML suspension Take 30 mLs by mouth every 6 (six) hours as needed for indigestion (indigestion).    . Cholecalciferol (VITAMIN D3 PO) Take 1 tablet by mouth daily.    Marland Kitchen ibuprofen (ADVIL,MOTRIN) 200 MG tablet Take 200 mg by mouth every 6 (six) hours as needed.    Marland Kitchen omeprazole (PRILOSEC) 10 MG capsule Take 10 mg by mouth daily.      No current facility-administered medications for this visit.    Allergies:   Codeine and Penicillins    Social History:  The patient  reports that she has never smoked. She does not have any smokeless tobacco history on  file.   Family History:  The patient's family history includes COPD in her mother; CVA in her father; Emphysema in her mother; Heart failure in her father; Pneumonia in her sister.    ROS:  Please see the history of present illness.   Otherwise, review of systems are positive for none.   All other systems are reviewed and negative.    PHYSICAL EXAM: VS:  BP 162/86 mmHg  Pulse 85  Ht 5' 7.5" (1.715 m)  Wt 170 lb 12.8 oz (77.474 kg)  BMI 26.34 kg/m2  SpO2 97% , BMI Body mass index is 26.34 kg/(m^2). GEN: Well nourished, well developed, in no acute distress HEENT: normal Neck: no JVD, carotid bruits, or masses Cardiac: RRR; no rubs, or gallops,no edema.  2/6 SM at RUSB to LLSB  Respiratory:  clear to auscultation bilaterally, normal work of breathing GI: soft, nontender, nondistended, + BS MS: no deformity or atrophy Skin: warm and dry, no rash Neuro:  Strength and sensation are intact Psych: euthymic mood, full affect   EKG:  EKG is not ordered today.    Recent Labs: 12/29/2014: ALT 14; BUN 19; Creatinine 0.91; Hemoglobin 14.3; Platelets 234; Potassium 4.0; Sodium 140    Lipid Panel No results found for: CHOL, TRIG, HDL, CHOLHDL, VLDL, LDLCALC, LDLDIRECT    Wt Readings from  Last 3 Encounters:  01/15/15 170 lb 12.8 oz (77.474 kg)       ASSESSMENT AND PLAN:  1.  Pulmonic stenosis - She has not had an echo in over 3 years so I will get a repeat echo  2.  HTN - BP elevated today but at home it runs 135-140/70's.  I have asked her to check her BP daily for a week and call with the results.   3.  History of rheumatic fever   Current medicines are reviewed at length with the patient today.  The patient does not have concerns regarding medicines.  The following changes have been made:  no change  Labs/ tests ordered today include: None  No orders of the defined types were placed in this encounter.     Disposition:   FU with me in 1 year   Signed, Sueanne Margarita,  MD  01/15/2015 3:59 PM    [facility] [address], [place], [place]  [address] Phone: [phone]; Fax: [phone]

## 2015-01-15 NOTE — Patient Instructions (Signed)
Your physician has requested that you have an echocardiogram. Echocardiography is a painless test that uses sound waves to create images of your heart. It provides your doctor with information about the size and shape of your heart and how well your heart's chambers and valves are working. This procedure takes approximately one hour. There are no restrictions for this procedure.  Your physician wants you to follow-up in: 1 year with Dr. Radford Pax. You will receive a reminder letter in the mail two months in advance. If you don't receive a letter, please call our office to schedule the follow-up appointment.

## 2015-01-19 ENCOUNTER — Ambulatory Visit (HOSPITAL_COMMUNITY): Payer: Medicare Other | Attending: Family Medicine

## 2015-01-19 DIAGNOSIS — I370 Nonrheumatic pulmonary valve stenosis: Secondary | ICD-10-CM | POA: Diagnosis present

## 2015-01-19 NOTE — Progress Notes (Signed)
2D Echo completed. 01/19/2015 

## 2015-01-21 ENCOUNTER — Telehealth: Payer: Self-pay | Admitting: Cardiology

## 2015-01-21 NOTE — Telephone Encounter (Signed)
Will forward to [employer].

## 2015-01-21 NOTE — Telephone Encounter (Signed)
New problem    Pt returning a call from [facility] from yesterday 2.24.15. Pt don't know why she was calling.

## 2015-01-22 NOTE — Telephone Encounter (Signed)
Left message to call back  

## 2015-01-25 NOTE — Telephone Encounter (Signed)
BP stable - no change in  meds

## 2015-01-25 NOTE — Telephone Encounter (Signed)
Left message for patient to continue current medications and to call if she needs anything.

## 2015-01-25 NOTE — Telephone Encounter (Signed)
Patient informed of ECHO results and verbal understanding expressed.   Patient also wishes to give BP log from the last week:  2/20: 138/79 HR 71 2/21: 132/72 HR 77 2/22: 136/79 HR 80 2/23: 129/78 HR 90 2/24: 138/83 HR 89 2/25: 133/77 HR 86 2/26: 129/71 HR 109          Recheck: 129/69 HR 100 2/28: 122/69 HR 91   To Dr. Radford Pax for review.

## 2015-06-09 ENCOUNTER — Encounter (INDEPENDENT_AMBULATORY_CARE_PROVIDER_SITE_OTHER): Payer: Medicare Other | Admitting: Ophthalmology

## 2015-06-09 DIAGNOSIS — H43813 Vitreous degeneration, bilateral: Secondary | ICD-10-CM

## 2015-06-09 DIAGNOSIS — H3531 Nonexudative age-related macular degeneration: Secondary | ICD-10-CM

## 2015-06-09 DIAGNOSIS — H33302 Unspecified retinal break, left eye: Secondary | ICD-10-CM

## 2015-07-28 ENCOUNTER — Other Ambulatory Visit: Payer: Self-pay

## 2015-07-28 DIAGNOSIS — Z1231 Encounter for screening mammogram for malignant neoplasm of breast: Secondary | ICD-10-CM

## 2015-08-04 ENCOUNTER — Ambulatory Visit
Admission: RE | Admit: 2015-08-04 | Discharge: 2015-08-04 | Disposition: A | Discharge status: Home / Self Care | Payer: Medicare Other | Source: Ambulatory Visit

## 2015-08-04 DIAGNOSIS — Z1231 Encounter for screening mammogram for malignant neoplasm of breast: Secondary | ICD-10-CM

## 2016-08-03 ENCOUNTER — Other Ambulatory Visit: Payer: Self-pay | Admitting: Family Medicine

## 2016-08-03 DIAGNOSIS — Z1231 Encounter for screening mammogram for malignant neoplasm of breast: Secondary | ICD-10-CM

## 2016-08-10 ENCOUNTER — Ambulatory Visit
Admission: RE | Admit: 2016-08-10 | Discharge: 2016-08-10 | Disposition: A | Discharge status: Home / Self Care | Payer: Medicare Other | Source: Ambulatory Visit | Attending: Family Medicine | Admitting: Family Medicine

## 2016-08-10 DIAGNOSIS — Z1231 Encounter for screening mammogram for malignant neoplasm of breast: Secondary | ICD-10-CM

## 2016-09-13 ENCOUNTER — Encounter: Payer: Self-pay | Admitting: Cardiology

## 2016-09-13 ENCOUNTER — Ambulatory Visit (INDEPENDENT_AMBULATORY_CARE_PROVIDER_SITE_OTHER): Payer: Medicare Other | Admitting: Cardiology

## 2016-09-13 VITALS — BP 124/68 | HR 73 | Ht 67.0 in | Wt 153.4 lb

## 2016-09-13 DIAGNOSIS — R002 Palpitations: Secondary | ICD-10-CM

## 2016-09-13 DIAGNOSIS — I10 Essential (primary) hypertension: Secondary | ICD-10-CM

## 2016-09-13 DIAGNOSIS — I0989 Other specified rheumatic heart diseases: Secondary | ICD-10-CM

## 2016-09-13 NOTE — Patient Instructions (Signed)
Medication Instructions:  Your physician recommends that you continue on your current medications as directed. Please refer to the Current Medication list given to you today.   Labwork: None  Testing/Procedures: Your physician has requested that you have an echocardiogram. Echocardiography is a painless test that uses sound waves to create images of your heart. It provides your doctor with information about the size and shape of your heart and how well your heart's chambers and valves are working. This procedure takes approximately one hour. There are no restrictions for this procedure.   Your physician has recommended that you wear an event monitor. Event monitors are medical devices that record the heart's electrical activity. Doctors most often [place] these monitors to diagnose arrhythmias. Arrhythmias are problems with the speed or rhythm of the heartbeat. The monitor is a small, portable device. You can wear one while you do your normal daily activities. This is usually used to diagnose what is causing palpitations/syncope (passing out).  Follow-Up: Your physician wants you to follow-up in: 1 year with Dr. Radford Pax. You will receive a reminder letter in the mail two months in advance. If you don't receive a letter, please call our office to schedule the follow-up appointment.   Any Other Special Instructions Will Be Listed Below (If Applicable).     If you need a refill on your cardiac medications before your next appointment, please call your pharmacy.

## 2016-09-13 NOTE — Progress Notes (Signed)
Cardiology Office Note    Date:  09/13/2016   ID:  Carrie Spencer, DOB 15-Aug-1934, MRN [id number]  PCP:  Gennette Pac, MD  Cardiologist:  Fransico Him, MD   Chief Complaint  Patient presents with  . Follow-up    pulmonic stenosis, HTN    History of Present Illness:  Carrie Spencer is a 82 y.o. female who presents for followup of pulmonic stenosis.  She has a history of rheumatic fever, diastolic dysfunction and pulmonic stenosis.  She has been doing well.   She denies any chest pain or pressure. She denies any SOB or DOE.  She denies any LE edema, dizziness or syncope.  She has noticed some palpitations that occur a few times monthly and will also notice at times her heart rate all of a sudden gets very fast.  The palpitations can last up to an hour and mainly feels fast.    Past Medical History:  Diagnosis Date  . Diastolic dysfunction   . DJD (degenerative joint disease) of cervical spine   . Osteopenia   . Pulmonic stenosis 01/15/2015  . Rheumatic fever 01/15/2015  . Vitamin D deficiency     Past Surgical History:  Procedure Laterality Date  . BREAST BIOPSY    . LAPAROSCOPIC CHOLECYSTECTOMY  2009  . PARTIAL HYSTERECTOMY      Current Medications: Outpatient Medications Prior to Visit  Medication Sig Dispense Refill  . bismuth subsalicylate (PEPTO BISMOL) 262 MG/15ML suspension Take 30 mLs by mouth every 6 (six) hours as needed for indigestion (indigestion).    . Cholecalciferol (VITAMIN D3 PO) Take 1 tablet by mouth daily.    Marland Kitchen ibuprofen (ADVIL,MOTRIN) 200 MG tablet Take 200 mg by mouth every 6 (six) hours as needed.    Marland Kitchen omeprazole (PRILOSEC) 10 MG capsule Take 10 mg by mouth daily.      No facility-administered medications prior to visit.      Allergies:   Codeine and Penicillins   Social History   Social History  . Marital status: Married    Spouse name: N/A  . Number of children: N/A  . Years of education: N/A   Social History Main Topics  .  Smoking status: Never Smoker  . Smokeless tobacco: None  . Alcohol use None  . Drug use: Unknown  . Sexual activity: Not Asked   Other Topics Concern  . None   Social History Narrative  . None     Family History:  The patient's family history includes COPD in her mother; CVA in her father; Emphysema in her mother; Heart failure in her father; Pneumonia in her sister.   ROS:   Please see the history of present illness.    ROS All other systems reviewed and are negative.  No flowsheet data found.     PHYSICAL EXAM:   VS:  BP 124/68   Pulse 73   Ht 5\' 7"  (1.702 m)   Wt 153 lb 6.4 oz (69.6 kg)   BMI 24.03 kg/m    GEN: Well nourished, well developed, in no acute distress  HEENT: normal  Neck: no JVD, carotid bruits, or masses Cardiac: RRR; no  rubs, or gallops,no edema.  Intact distal pulses bilaterally. 2/6 SM at RUSB to LLSB Respiratory:  clear to auscultation bilaterally, normal work of breathing GI: soft, nontender, nondistended, + BS MS: no deformity or atrophy  Skin: warm and dry, no rash Neuro:  Alert and Oriented x 3, Strength and sensation are intact Psych:  euthymic mood, full affect  Wt Readings from Last 3 Encounters:  09/13/16 153 lb 6.4 oz (69.6 kg)  01/15/15 170 lb 12.8 oz (77.5 kg)      Studies/Labs Reviewed:   EKG:  EKG is ordered today.  The ekg ordered today demonstrates NSR at 73bpm with no ST changes  Recent Labs: No results found for requested labs within last 8760 hours.   Lipid Panel No results found for: CHOL, TRIG, HDL, CHOLHDL, VLDL, LDLCALC, LDLDIRECT  Additional studies/ records that were reviewed today include:  none    ASSESSMENT:    1. Rheumatic pulmonary valve stenosis   2. Benign essential HTN   3. Heart palpitations      PLAN:  In order of problems listed above:  1. Rheumatic pulmonic stenosis - echo 12/2014 showed moderate PS with peak gradient 59mmHg.  She is asymptomatic. Repeat echo to assess for progression.    2. HTN - BP controlled on low sodium diet alone.  3. Palpitations - I will get a 30 day heart monitor to rule out PAF    Medication Adjustments/Labs and Tests Ordered: Current medicines are reviewed at length with the patient today.  Concerns regarding medicines are outlined above.  Medication changes, Labs and Tests ordered today are listed in the Patient Instructions below.  There are no Patient Instructions on file for this visit.   Signed, Fransico Him, MD  09/13/2016 2:04 PM    [facility] [place], [place], [place]  [address] Phone: [phone]; Fax: [phone]

## 2016-09-14 ENCOUNTER — Encounter: Payer: Self-pay | Admitting: Cardiology

## 2016-09-22 ENCOUNTER — Ambulatory Visit: Payer: Medicare Other

## 2016-09-28 ENCOUNTER — Ambulatory Visit (HOSPITAL_COMMUNITY): Payer: Medicare Other | Attending: Cardiology

## 2016-09-28 ENCOUNTER — Other Ambulatory Visit: Payer: Self-pay

## 2016-09-28 DIAGNOSIS — R002 Palpitations: Secondary | ICD-10-CM | POA: Diagnosis not present

## 2016-09-28 DIAGNOSIS — I5030 Unspecified diastolic (congestive) heart failure: Secondary | ICD-10-CM | POA: Diagnosis not present

## 2016-09-28 DIAGNOSIS — I351 Nonrheumatic aortic (valve) insufficiency: Secondary | ICD-10-CM | POA: Diagnosis not present

## 2016-09-28 DIAGNOSIS — I0989 Other specified rheumatic heart diseases: Secondary | ICD-10-CM

## 2016-09-28 DIAGNOSIS — I370 Nonrheumatic pulmonary valve stenosis: Secondary | ICD-10-CM | POA: Insufficient documentation

## 2016-10-02 ENCOUNTER — Telehealth: Payer: Self-pay | Admitting: Cardiology

## 2016-10-02 DIAGNOSIS — I370 Nonrheumatic pulmonary valve stenosis: Secondary | ICD-10-CM

## 2016-10-02 NOTE — Telephone Encounter (Signed)
New message     Patient calling back stating the nurse called on Friday returning call back

## 2016-10-02 NOTE — Telephone Encounter (Signed)
-----   Message from Sueanne Margarita, MD sent at 09/29/2016 11:52 AM EDT ----- Echo showed normal LVF with mild AR, moderate PS - repeat echo in 1 year

## 2016-10-02 NOTE — Telephone Encounter (Signed)
Informed patient of results and verbal understanding expressed.  Repeat ECHO ordered to be scheduled in 1 year. Patient agrees with treatment plan. 

## 2017-07-05 ENCOUNTER — Other Ambulatory Visit: Payer: Self-pay | Admitting: Family Medicine

## 2017-07-05 DIAGNOSIS — Z1231 Encounter for screening mammogram for malignant neoplasm of breast: Secondary | ICD-10-CM

## 2017-08-13 ENCOUNTER — Ambulatory Visit
Admission: RE | Admit: 2017-08-13 | Discharge: 2017-08-13 | Disposition: A | Discharge status: Home / Self Care | Payer: Medicare Other | Source: Ambulatory Visit | Attending: Family Medicine | Admitting: Family Medicine

## 2017-08-13 DIAGNOSIS — Z1231 Encounter for screening mammogram for malignant neoplasm of breast: Secondary | ICD-10-CM

## 2017-08-23 ENCOUNTER — Other Ambulatory Visit: Payer: Self-pay | Admitting: Family Medicine

## 2017-08-23 DIAGNOSIS — M8580 Other specified disorders of bone density and structure, unspecified site: Secondary | ICD-10-CM

## 2017-09-03 ENCOUNTER — Encounter: Payer: Self-pay | Admitting: Cardiology

## 2017-09-17 ENCOUNTER — Encounter: Payer: Self-pay | Admitting: Cardiology

## 2017-09-17 ENCOUNTER — Ambulatory Visit (INDEPENDENT_AMBULATORY_CARE_PROVIDER_SITE_OTHER): Payer: Medicare Other | Admitting: Cardiology

## 2017-09-17 VITALS — BP 136/64 | HR 67 | Ht 67.0 in | Wt 141.0 lb

## 2017-09-17 DIAGNOSIS — I10 Essential (primary) hypertension: Secondary | ICD-10-CM | POA: Diagnosis not present

## 2017-09-17 DIAGNOSIS — I0989 Other specified rheumatic heart diseases: Secondary | ICD-10-CM | POA: Diagnosis not present

## 2017-09-17 NOTE — Progress Notes (Signed)
Cardiology Office Note:    Date:  09/17/2017   ID:  Carrie Spencer, DOB Dec 04, 1933, MRN [id number]  PCP:  Hulan Fess, MD  Cardiologist:  Fransico Him, MD   Referring MD: Hulan Fess, MD   Chief Complaint  Patient presents with  . Follow-up    Pulmonic stenosis    History of Present Illness:    Carrie Spencer is a 83 y.o. female with a hx of rheumatic fever, diastolic dysfunction and pulmonic stenosis.  She is here today for followup and is doing well.  She denies any chest pain or pressure, SOB, DOE, PND, orthopnea, LE edema, dizziness, palpitations or syncope. She is compliant with her meds and is tolerating meds with no SE.    Past Medical History:  Diagnosis Date  . Diastolic dysfunction   . DJD (degenerative joint disease) of cervical spine   . Osteopenia   . Pulmonic stenosis 01/15/2015  . Rheumatic fever 01/15/2015  . Vitamin D deficiency     Past Surgical History:  Procedure Laterality Date  . BREAST BIOPSY    . BREAST EXCISIONAL BIOPSY Bilateral   . LAPAROSCOPIC CHOLECYSTECTOMY  2009  . PARTIAL HYSTERECTOMY      Current Medications: Current Meds  Medication Sig  . Cholecalciferol (VITAMIN D3 PO) Take 1 tablet by mouth daily.  Marland Kitchen omeprazole (PRILOSEC) 10 MG capsule Take 10 mg by mouth daily.      Allergies:   Codeine and Penicillins   Social History   Social History  . Marital status: Married    Spouse name: N/A  . Number of children: N/A  . Years of education: N/A   Social History Main Topics  . Smoking status: Never Smoker  . Smokeless tobacco: Never Used  . Alcohol use No  . Drug use: No  . Sexual activity: Not Asked   Other Topics Concern  . None   Social History Narrative  . None     Family History: The patient's family history includes COPD in her mother; CVA in her father; Emphysema in her mother; Heart failure in her father; Pneumonia in her sister.  ROS:   Please see the history of present illness.    ROS  All other  systems reviewed and negative.   EKGs/Labs/Other Studies Reviewed:    The following studies were reviewed today: none  EKG:  EKG is  ordered today.  The ekg ordered today demonstrates NSR with no ST change  Recent Labs: No results found for requested labs within last 8760 hours.   Recent Lipid Panel No results found for: CHOL, TRIG, HDL, CHOLHDL, VLDL, LDLCALC, LDLDIRECT  Physical Exam:    VS:  BP 136/64   Pulse 67   Ht 5\' 7"  (1.702 m)   Wt 141 lb (64 kg)   BMI 22.08 kg/m     Wt Readings from Last 3 Encounters:  09/17/17 141 lb (64 kg)  09/13/16 153 lb 6.4 oz (69.6 kg)  01/15/15 170 lb 12.8 oz (77.5 kg)     GEN:  Well nourished, well developed in no acute distress HEENT: Normal NECK: No JVD; No carotid bruits LYMPHATICS: No lymphadenopathy CARDIAC: RRR, no  rubs, gallops.  2/6 SM at LUSB and RUSB RESPIRATORY:  Clear to auscultation without rales, wheezing or rhonchi  ABDOMEN: Soft, non-tender, non-distended MUSCULOSKELETAL:  No edema; No deformity  SKIN: Warm and dry NEUROLOGIC:  Alert and oriented x 3 PSYCHIATRIC:  Normal affect   ASSESSMENT:    1. Rheumatic pulmonary valve  stenosis   2. Benign essential HTN    PLAN:    In order of problems listed above:  1.  Rheumatic pulmonary stenosis - this was moderate on echo a year ago.  She is completely asymptomatic.  I will repeat echo to make sure this has not progressed.  2.  HTN - her BP is well controlled on exam today.  She is currently on no antihypertensive meds.   Medication Adjustments/Labs and Tests Ordered: Current medicines are reviewed at length with the patient today.  Concerns regarding medicines are outlined above.  No orders of the defined types were placed in this encounter.  No orders of the defined types were placed in this encounter.   Signed, Fransico Him, MD  09/17/2017 8:40 AM    [place]

## 2017-09-17 NOTE — Patient Instructions (Addendum)
Your physician recommends that you continue on your current medications as directed. Please refer to the Current Medication list given to you today.  Your physician has requested that you have an echocardiogram. Echocardiography is a painless test that uses sound waves to create images of your heart. It provides your doctor with information about the size and shape of your heart and how well your heart's chambers and valves are working. This procedure takes approximately one hour. There are no restrictions for this procedure.  Your physician wants you to follow-up in: 1 year with Dr. Radford Pax.  You will receive a reminder letter in the mail two months in advance. If you don't receive a letter, please call our office to schedule the follow-up appointment.

## 2017-09-19 ENCOUNTER — Ambulatory Visit
Admission: RE | Admit: 2017-09-19 | Discharge: 2017-09-19 | Disposition: A | Discharge status: Home / Self Care | Payer: Medicare Other | Source: Ambulatory Visit | Attending: Family Medicine | Admitting: Family Medicine

## 2017-09-19 DIAGNOSIS — M8580 Other specified disorders of bone density and structure, unspecified site: Secondary | ICD-10-CM

## 2017-10-05 ENCOUNTER — Ambulatory Visit (HOSPITAL_COMMUNITY): Payer: Medicare Other | Attending: Internal Medicine

## 2017-10-05 ENCOUNTER — Other Ambulatory Visit: Payer: Self-pay

## 2017-10-05 DIAGNOSIS — I351 Nonrheumatic aortic (valve) insufficiency: Secondary | ICD-10-CM | POA: Diagnosis not present

## 2017-10-05 DIAGNOSIS — I0989 Other specified rheumatic heart diseases: Secondary | ICD-10-CM

## 2017-10-05 DIAGNOSIS — I10 Essential (primary) hypertension: Secondary | ICD-10-CM | POA: Insufficient documentation

## 2017-10-07 ENCOUNTER — Encounter: Payer: Self-pay | Admitting: Cardiology

## 2017-10-08 ENCOUNTER — Telehealth: Payer: Self-pay

## 2017-10-08 DIAGNOSIS — I351 Nonrheumatic aortic (valve) insufficiency: Secondary | ICD-10-CM

## 2017-10-08 NOTE — Telephone Encounter (Signed)
Left detailed message on mobile phone (DPR on file) of echo results. Will order echo for 1 year, per Dr. Radford Pax. Informed patient to call back with any questions or concerns.

## 2017-10-08 NOTE — Telephone Encounter (Signed)
-----   Message from Sueanne Margarita, MD sent at 10/07/2017  9:17 PM EST ----- Echo showed mild LVH with normal LVF , mild AR, trivial MR, moderate pulmonic stenosis - no change from study a year ago.  Please repeat echo in 1 year

## 2018-07-05 ENCOUNTER — Other Ambulatory Visit: Payer: Self-pay | Admitting: Family Medicine

## 2018-07-05 DIAGNOSIS — Z1231 Encounter for screening mammogram for malignant neoplasm of breast: Secondary | ICD-10-CM

## 2018-08-15 ENCOUNTER — Ambulatory Visit: Payer: Medicare Other

## 2018-09-11 ENCOUNTER — Ambulatory Visit: Payer: Medicare Other

## 2018-10-02 ENCOUNTER — Ambulatory Visit
Admission: RE | Admit: 2018-10-02 | Discharge: 2018-10-02 | Disposition: A | Discharge status: Home / Self Care | Payer: Medicare Other | Source: Ambulatory Visit | Attending: Family Medicine | Admitting: Family Medicine

## 2018-10-02 DIAGNOSIS — Z1231 Encounter for screening mammogram for malignant neoplasm of breast: Secondary | ICD-10-CM

## 2018-10-03 ENCOUNTER — Encounter: Payer: Self-pay | Admitting: Cardiology

## 2018-10-03 ENCOUNTER — Ambulatory Visit: Payer: Medicare Other | Admitting: Cardiology

## 2018-10-03 VITALS — BP 126/64 | HR 91 | Ht 67.0 in | Wt 140.6 lb

## 2018-10-03 DIAGNOSIS — I10 Essential (primary) hypertension: Secondary | ICD-10-CM | POA: Diagnosis not present

## 2018-10-03 DIAGNOSIS — I00 Rheumatic fever without heart involvement: Secondary | ICD-10-CM | POA: Diagnosis not present

## 2018-10-03 DIAGNOSIS — I0989 Other specified rheumatic heart diseases: Secondary | ICD-10-CM

## 2018-10-03 NOTE — Addendum Note (Signed)
Addended by: Drue Novel I on: 10/03/2018 01:40 PM   Modules accepted: Orders

## 2018-10-03 NOTE — Progress Notes (Signed)
Cardiology Office Note:    Date:  10/03/2018   ID:  Carrie Spencer, DOB February 26, 1934, MRN [id number]  PCP:  Hulan Fess, MD  Cardiologist:  No primary care provider on file.    Referring MD: Hulan Fess, MD   Chief Complaint  Patient presents with  . Follow-up    Pulmonic stenosis    History of Present Illness:    Carrie Spencer is a 84 y.o. female with a hx of fever, diastolic dysfunction and pulmonic stenosis.  She is here today for followup and is doing well.  She denies any chest pain or pressure, SOB, DOE, PND, orthopnea, dizziness, palpitations or syncope. She did have a spell of LE edema when riding a bus on a trip but no other times.  She is compliant with her meds and is tolerating meds with no SE.  She walks more than 3.5 miles daily at the gym.   Past Medical History:  Diagnosis Date  . Diastolic dysfunction   . DJD (degenerative joint disease) of cervical spine   . Osteopenia   . Pulmonic stenosis 01/15/2015   moderate by echo 09/2017  . Rheumatic fever 01/15/2015  . Vitamin D deficiency     Past Surgical History:  Procedure Laterality Date  . BREAST BIOPSY    . BREAST EXCISIONAL BIOPSY Bilateral   . LAPAROSCOPIC CHOLECYSTECTOMY  2009  . PARTIAL HYSTERECTOMY      Current Medications: Current Meds  Medication Sig  . Cholecalciferol (VITAMIN D3 PO) Take 1 tablet by mouth daily.  Marland Kitchen omeprazole (PRILOSEC) 10 MG capsule Take 10 mg by mouth daily.      Allergies:   Codeine and Penicillins   Social History   Socioeconomic History  . Marital status: Married    Spouse name: Not on file  . Number of children: Not on file  . Years of education: Not on file  . Highest education level: Not on file  Occupational History  . Not on file  Social Needs  . Financial resource strain: Not on file  . Food insecurity:    Worry: Not on file    Inability: Not on file  . Transportation needs:    Medical: Not on file    Non-medical: Not on file  Tobacco Use  .  Smoking status: Never Smoker  . Smokeless tobacco: Never Used  Substance and Sexual Activity  . Alcohol use: No  . Drug use: No  . Sexual activity: Not on file  Lifestyle  . Physical activity:    Days per week: Not on file    Minutes per session: Not on file  . Stress: Not on file  Relationships  . Social connections:    Talks on phone: Not on file    Gets together: Not on file    Attends religious service: Not on file    Active member of club or organization: Not on file    Attends meetings of clubs or organizations: Not on file    Relationship status: Not on file  Other Topics Concern  . Not on file  Social History Narrative  . Not on file     Family History: The patient's family history includes COPD in her mother; CVA in her father; Emphysema in her mother; Heart failure in her father; Pneumonia in her sister. There is no history of Breast cancer.  ROS:   Please see the history of present illness.    ROS  All other systems reviewed and negative.  EKGs/Labs/Other Studies Reviewed:    The following studies were reviewed today: none  EKG:  EKG is  ordered today.  The ekg ordered today demonstrates normal sinus rhythm at 91 bpm with no ST-T wave abnormality.  Recent Labs: No results found for requested labs within last 8760 hours.   Recent Lipid Panel No results found for: CHOL, TRIG, HDL, CHOLHDL, VLDL, LDLCALC, LDLDIRECT  Physical Exam:    VS:  BP 126/64   Pulse 91   Ht 5\' 7"  (1.702 m)   Wt 140 lb 9.6 oz (63.8 kg)   BMI 22.02 kg/m     Wt Readings from Last 3 Encounters:  10/03/18 140 lb 9.6 oz (63.8 kg)  09/17/17 141 lb (64 kg)  09/13/16 153 lb 6.4 oz (69.6 kg)     GEN:  Well nourished, well developed in no acute distress HEENT: Normal NECK: No JVD; No carotid bruits LYMPHATICS: No lymphadenopathy CARDIAC: RRR, no murmurs, rubs, gallops RESPIRATORY:  Clear to auscultation without rales, wheezing or rhonchi  ABDOMEN: Soft, non-tender,  non-distended MUSCULOSKELETAL:  No edema; No deformity  SKIN: Warm and dry NEUROLOGIC:  Alert and oriented x 3 PSYCHIATRIC:  Normal affect   ASSESSMENT:    1. Rheumatic pulmonary valve stenosis   2. Benign essential HTN   3. Rheumatic fever    PLAN:    In order of problems listed above:  1.  Rheumatic pulmonic stenosis -2D echo 10/05/2017 showed normal LV systolic function with grade 1 diastolic dysfunction, moderate pulmonic stenosis with a peak gradient of 52 mmHg.  I will repeat 2D echocardiogram to make sure this is not progressed.  She is asymptomatic.  2.  HTN -BP is well controlled on exam today.  She has not required any antihypertensive medication.  3.  H/O Rheumatic fever -    Medication Adjustments/Labs and Tests Ordered: Current medicines are reviewed at length with the patient today.  Concerns regarding medicines are outlined above.  No orders of the defined types were placed in this encounter.  No orders of the defined types were placed in this encounter.   Signed, Fransico Him, MD  10/03/2018 1:32 PM    [place]

## 2018-10-03 NOTE — Patient Instructions (Signed)
Medication Instructions:  Your physician recommends that you continue on your current medications as directed. Please refer to the Current Medication list given to you today.  If you need a refill on your cardiac medications before your next appointment, please call your pharmacy.   Lab work: None Ordered  If you have labs (blood work) drawn today and your tests are completely normal, you will receive your results only by: Marland Kitchen MyChart Message (if you have MyChart) OR . A paper copy in the mail If you have any lab test that is abnormal or we need to change your treatment, we will call you to review the results.  Testing/Procedures: Your physician has requested that you have an echocardiogram. Echocardiography is a painless test that uses sound waves to create images of your heart. It provides your doctor with information about the size and shape of your heart and how well your heart's chambers and valves are working. This procedure takes approximately one hour. There are no restrictions for this procedure.   Follow-Up: At [facility], you and your health needs are our priority.  As part of our continuing mission to provide you with exceptional heart care, we have created designated Provider Care Teams.  These Care Teams include your primary Cardiologist (physician) and Advanced Practice Providers (APPs -  Physician Assistants and Nurse Practitioners) who all work together to provide you with the care you need, when you need it. . You will need a follow up appointment in 1 year.  Please call our office 2 months in advance to schedule this appointment.  You may see Fransico Him, MD or one of the following Advanced Practice Providers on your designated Care Team:   . Lyda Jester, PA-C . Dayna Dunn, PA-C . Ermalinda Barrios, PA-C  Any Other Special Instructions Will Be Listed Below (If Applicable).

## 2018-10-08 ENCOUNTER — Other Ambulatory Visit: Payer: Self-pay

## 2018-10-08 ENCOUNTER — Ambulatory Visit (HOSPITAL_COMMUNITY): Payer: Medicare Other | Attending: Cardiology

## 2018-10-08 DIAGNOSIS — I0989 Other specified rheumatic heart diseases: Secondary | ICD-10-CM | POA: Diagnosis present

## 2019-09-09 ENCOUNTER — Other Ambulatory Visit: Payer: Self-pay | Admitting: Family Medicine

## 2019-09-09 DIAGNOSIS — Z1231 Encounter for screening mammogram for malignant neoplasm of breast: Secondary | ICD-10-CM

## 2019-10-29 ENCOUNTER — Ambulatory Visit
Admission: RE | Admit: 2019-10-29 | Discharge: 2019-10-29 | Disposition: A | Discharge status: Home / Self Care | Payer: Medicare Other | Source: Ambulatory Visit | Attending: Family Medicine | Admitting: Family Medicine

## 2019-10-29 ENCOUNTER — Other Ambulatory Visit: Payer: Self-pay

## 2019-10-29 DIAGNOSIS — Z1231 Encounter for screening mammogram for malignant neoplasm of breast: Secondary | ICD-10-CM

## 2019-10-31 ENCOUNTER — Encounter: Payer: Self-pay | Admitting: Cardiology

## 2019-10-31 ENCOUNTER — Ambulatory Visit: Payer: Medicare Other | Admitting: Cardiology

## 2019-10-31 ENCOUNTER — Other Ambulatory Visit: Payer: Self-pay

## 2019-10-31 VITALS — BP 144/66 | HR 81 | Ht 67.0 in | Wt 140.6 lb

## 2019-10-31 DIAGNOSIS — I10 Essential (primary) hypertension: Secondary | ICD-10-CM | POA: Diagnosis not present

## 2019-10-31 DIAGNOSIS — I0989 Other specified rheumatic heart diseases: Secondary | ICD-10-CM | POA: Diagnosis not present

## 2019-10-31 NOTE — Patient Instructions (Signed)
Medication Instructions:  °Your physician recommends that you continue on your current medications as directed. Please refer to the Current Medication list given to you today. ° °*If you need a refill on your cardiac medications before your next appointment, please call your pharmacy* ° °Testing/Procedures: °Your physician has requested that you have an echocardiogram. Echocardiography is a painless test that uses sound waves to create images of your heart. It provides your doctor with information about the size and shape of your heart and how well your heart’s chambers and valves are working. This procedure takes approximately one hour. There are no restrictions for this procedure. ° °Follow-Up: °At [facility], you and your health needs are our priority.  As part of our continuing mission to provide you with exceptional heart care, we have created designated Provider Care Teams.  These Care Teams include your primary Cardiologist (physician) and Advanced Practice Providers (APPs -  Physician Assistants and Nurse Practitioners) who all work together to provide you with the care you need, when you need it. ° °Your next appointment:   °1 year(s) ° °The format for your next appointment:   °In Person ° °Provider:   °Traci Turner, MD   ° ° ° °

## 2019-10-31 NOTE — Addendum Note (Signed)
Addended by: Antonieta Iba on: 10/31/2019 03:48 PM   Modules accepted: Orders

## 2019-10-31 NOTE — Progress Notes (Addendum)
Cardiology Office Note:    Date:  10/31/2019   ID:  Rito Ehrlich [place], DOB 19-Sep-1934, MRN [id number]  PCP:  Hulan Fess, MD  Cardiologist:  Fransico Him, MD    Referring MD: Hulan Fess, MD   Chief Complaint  Patient presents with  . Follow-up    Pulmonic stesnosis     History of Present Illness:    Carrie Spencer is a 85 y.o. female with a hx of rheumatic fever, diastolic dysfunction and pulmonic stenosis.  She is here today for followup and is doing well.  She denies any chest pain or pressure, SOB, DOE, PND, orthopnea, LE edema, dizziness, palpitations or syncope. She is compliant with her meds and is tolerating meds with no SE.  She walks 5 miles daily and sometimes 10 miles.  Past Medical History:  Diagnosis Date  . Diastolic dysfunction   . DJD (degenerative joint disease) of cervical spine   . Osteopenia   . Pulmonic stenosis 01/15/2015   moderate by echo 09/2017  . Rheumatic fever 01/15/2015  . Vitamin D deficiency     Past Surgical History:  Procedure Laterality Date  . BREAST BIOPSY    . BREAST EXCISIONAL BIOPSY Bilateral   . LAPAROSCOPIC CHOLECYSTECTOMY  2009  . PARTIAL HYSTERECTOMY      Current Medications: Current Meds  Medication Sig  . alendronate (FOSAMAX) 70 MG tablet Take 70 mg by mouth once a week.  . Cholecalciferol (VITAMIN D3 PO) Take 1 tablet by mouth daily.  . SYSTANE ULTRA 0.4-0.3 % SOLN SMARTSIG:1 Drop(s) In Eye(s) As Needed     Allergies:   Codeine and Penicillins   Social History   Socioeconomic History  . Marital status: Married    Spouse name: Not on file  . Number of children: Not on file  . Years of education: Not on file  . Highest education level: Not on file  Occupational History  . Not on file  Social Needs  . Financial resource strain: Not on file  . Food insecurity    Worry: Not on file    Inability: Not on file  . Transportation needs    Medical: Not on file    Non-medical: Not on file  Tobacco Use  .  Smoking status: Never Smoker  . Smokeless tobacco: Never Used  Substance and Sexual Activity  . Alcohol use: No  . Drug use: No  . Sexual activity: Not on file  Lifestyle  . Physical activity    Days per week: Not on file    Minutes per session: Not on file  . Stress: Not on file  Relationships  . Social Herbalist on phone: Not on file    Gets together: Not on file    Attends religious service: Not on file    Active member of club or organization: Not on file    Attends meetings of clubs or organizations: Not on file    Relationship status: Not on file  Other Topics Concern  . Not on file  Social History Narrative  . Not on file     Family History: The patient's family history includes COPD in her mother; CVA in her father; Emphysema in her mother; Heart failure in her father; Pneumonia in her sister. There is no history of Breast cancer.  ROS:   Please see the history of present illness.    ROS  All other systems reviewed and negative.   EKGs/Labs/Other Studies Reviewed:  The following studies were reviewed today: none  EKG:  EKG is  ordered today.  The ekg ordered today demonstrates NSR with no ST changes  Recent Labs: No results found for requested labs within last 8760 hours.   Recent Lipid Panel No results found for: CHOL, TRIG, HDL, CHOLHDL, VLDL, LDLCALC, LDLDIRECT  Physical Exam:    VS:  BP (!) 144/66   Pulse 81   Ht 5\' 7"  (1.702 m)   Wt 140 lb 9.6 oz (63.8 kg)   BMI 22.02 kg/m     Wt Readings from Last 3 Encounters:  10/31/19 140 lb 9.6 oz (63.8 kg)  10/03/18 140 lb 9.6 oz (63.8 kg)  09/17/17 141 lb (64 kg)     GEN:  Well nourished, well developed in no acute distress HEENT: Normal NECK: No JVD; No carotid bruits LYMPHATICS: No lymphadenopathy CARDIAC: RRR, no  rubs, gallops.  2/6 SM at RUSB to LUSB RESPIRATORY:  Clear to auscultation without rales, wheezing or rhonchi  ABDOMEN: Soft, non-tender, non-distended MUSCULOSKELETAL:   No edema; No deformity  SKIN: Warm and dry NEUROLOGIC:  Alert and oriented x 3 PSYCHIATRIC:  Normal affect   ASSESSMENT:    1. Rheumatic pulmonary valve stenosis   2. Benign essential HTN    PLAN:    In order of problems listed above:  1.  Rheumatic pulmonic stenosis -2D echo a year ago showed moderate PS -will repeat echo to reassess gradient across pulmonic valve  2.  HTN -BP is controlled -she is not on BP meds    Medication Adjustments/Labs and Tests Ordered: Current medicines are reviewed at length with the patient today.  Concerns regarding medicines are outlined above.  Orders Placed This Encounter  Procedures  . EKG 12/Charge capture   No orders of the defined types were placed in this encounter.   Signed, Fransico Him, MD  10/31/2019 3:37 PM    [place]

## 2019-11-11 ENCOUNTER — Other Ambulatory Visit: Payer: Self-pay

## 2019-11-11 ENCOUNTER — Ambulatory Visit (HOSPITAL_COMMUNITY): Payer: Medicare Other | Attending: Cardiovascular Disease

## 2019-11-11 DIAGNOSIS — I0989 Other specified rheumatic heart diseases: Secondary | ICD-10-CM | POA: Diagnosis present

## 2020-02-07 ENCOUNTER — Ambulatory Visit: Payer: Medicare Other | Attending: Family Medicine

## 2020-02-07 DIAGNOSIS — Z23 Encounter for immunization: Secondary | ICD-10-CM

## 2020-02-07 NOTE — Progress Notes (Signed)
   Covid-19 Vaccination Clinic  Name:  CLARINE SNITKER    MRN: [id number] DOB: 09/03/1934  02/07/2020  Ms. Creson was observed post Covid-19 immunization for 15 minutes without incident. She was provided with Vaccine Information Sheet and instruction to access the V-Safe system.   Ms. Schmeiser was instructed to call 911 with any severe reactions post vaccine: Marland Kitchen Difficulty breathing  . Swelling of face and throat  . A fast heartbeat  . A bad rash all over body  . Dizziness and weakness   Immunizations Administered    Name Date Dose VIS Date Route   Pfizer COVID-19 Vaccine 02/07/2020  1:21 PM 0.3 mL 11/07/2019 Intramuscular   Manufacturer: [place]   Lot: [id number]   [place]: [id number]

## 2020-03-02 ENCOUNTER — Ambulatory Visit: Payer: Medicare Other | Attending: Internal Medicine

## 2020-03-02 DIAGNOSIS — Z23 Encounter for immunization: Secondary | ICD-10-CM

## 2020-03-02 NOTE — Progress Notes (Signed)
   Covid-19 Vaccination Clinic  Name:  Carrie Spencer    MRN: [id number] DOB: 05-19-1934  03/02/2020  [name] was observed post Covid-19 immunization for 15 minutes without incident. She was provided with Vaccine Information Sheet and instruction to access the V-Safe system.   [name] was instructed to call 911 with any severe reactions post vaccine: Marland Kitchen Difficulty breathing  . Swelling of face and throat  . A fast heartbeat  . A bad rash all over body  . Dizziness and weakness   Immunizations Administered    Name Date Dose VIS Date Route   Pfizer COVID-19 Vaccine 03/02/2020 12:54 PM 0.3 mL 11/07/2019 Intramuscular   Manufacturer: [place]   Lot: [id number]   [place]: [id number]

## 2020-05-19 ENCOUNTER — Telehealth: Payer: Self-pay | Admitting: Cardiology

## 2020-05-19 NOTE — Telephone Encounter (Signed)
Spoke with pt who states she has a small area above her left breast that is sore and has been sore for about a week.  Pt feels it is more muscular than cardiac but wanted to call for reassurance.  Pt is not on any current cardiac medications and f/u is due with Dr Radford Pax 10/2020.  Pt denies any other symptoms and reports she feels well and recently mowed her yard without problems. Is unaware of any injury or possible muscle strain. Pt advised to follow up with PCP d/t symptom being a small area of what she feels is muscular soreness above her left breast and in the absence of additional symptoms and feeling well.   Pt has not tried any OTC Ibuprofen or Tylenol for relief. Pt advised if she does develop active CP, SOB, edema or dizziness to go to ED for further evaluation.  Pt verbalizes understanding and agrees with current plan.

## 2020-05-19 NOTE — Telephone Encounter (Signed)
Patient states the feeling is more of a chest soreness than a pain.  Pt c/o of Chest Pain: STAT if CP now or developed within 24 hours  1. Are you having CP right now? Yes  2. Are you experiencing any other symptoms (ex. SOB, nausea, vomiting, sweating)? No  3. How long have you been experiencing CP? 1 week  4. Is your CP continuous or coming and going? Coming and going  5. Have you taken Nitroglycerin? No ?

## 2020-05-20 ENCOUNTER — Other Ambulatory Visit: Payer: Self-pay | Admitting: Family Medicine

## 2020-05-20 ENCOUNTER — Other Ambulatory Visit: Payer: Self-pay

## 2020-05-20 ENCOUNTER — Ambulatory Visit
Admission: RE | Admit: 2020-05-20 | Discharge: 2020-05-20 | Disposition: A | Discharge status: Home / Self Care | Payer: Medicare Other | Source: Ambulatory Visit | Attending: Family Medicine | Admitting: Family Medicine

## 2020-05-20 DIAGNOSIS — R071 Chest pain on breathing: Secondary | ICD-10-CM

## 2020-05-20 MED ORDER — IOPAMIDOL (ISOVUE-370) INJECTION 76%
75.0000 mL | Freq: Once | INTRAVENOUS | Status: AC | PRN
  Administered 2020-05-20: 75 mL via INTRAVENOUS

## 2020-10-01 ENCOUNTER — Other Ambulatory Visit: Payer: Self-pay | Admitting: Family Medicine

## 2020-10-01 DIAGNOSIS — Z1231 Encounter for screening mammogram for malignant neoplasm of breast: Secondary | ICD-10-CM

## 2020-11-10 ENCOUNTER — Ambulatory Visit: Payer: Medicare Other

## 2020-11-18 ENCOUNTER — Other Ambulatory Visit: Payer: Self-pay | Admitting: Family Medicine

## 2020-11-18 DIAGNOSIS — M810 Age-related osteoporosis without current pathological fracture: Secondary | ICD-10-CM

## 2020-12-03 ENCOUNTER — Encounter: Payer: Self-pay | Admitting: Cardiology

## 2020-12-03 ENCOUNTER — Ambulatory Visit: Payer: Medicare Other | Admitting: Cardiology

## 2020-12-03 ENCOUNTER — Other Ambulatory Visit: Payer: Self-pay

## 2020-12-03 VITALS — BP 144/70 | HR 59 | Ht 67.0 in | Wt 150.0 lb

## 2020-12-03 DIAGNOSIS — I10 Essential (primary) hypertension: Secondary | ICD-10-CM

## 2020-12-03 DIAGNOSIS — I0989 Other specified rheumatic heart diseases: Secondary | ICD-10-CM | POA: Diagnosis not present

## 2020-12-03 NOTE — Patient Instructions (Signed)

## 2020-12-03 NOTE — Progress Notes (Addendum)
Cardiology Office Note:    Date:  12/03/2020   ID:  Carrie Spencer, DOB 11-01-1934, MRN [id number]  PCP:  Hulan Fess, MD  Cardiologist:  Fransico Him, MD    Referring MD: Hulan Fess, MD   Chief Complaint  Patient presents with  . Follow-up    Pulmonic stenosis    History of Present Illness:    Carrie Spencer is a 87 y.o. female with a hx of rheumatic fever, diastolic dysfunction and pulmonic stenosis. She is here today for followup and is doing well.  She denies any chest pain or pressure, SOB, DOE, PND, orthopnea, LE edema, dizziness, palpitations or syncope. She is compliant with her meds and is tolerating meds with no SE.  She continues to walk 5 miles daily.  She says that she has been gaining weight despite exercising but has not had any LE edema.    Past Medical History:  Diagnosis Date  . Diastolic dysfunction   . DJD (degenerative joint disease) of cervical spine   . Osteopenia   . Pulmonic stenosis 01/15/2015   moderate by echo 09/2017  . Rheumatic fever 01/15/2015  . Vitamin D deficiency     Past Surgical History:  Procedure Laterality Date  . BREAST BIOPSY    . BREAST EXCISIONAL BIOPSY Bilateral   . LAPAROSCOPIC CHOLECYSTECTOMY  2009  . PARTIAL HYSTERECTOMY      Current Medications: Current Meds  Medication Sig  . acetaminophen (TYLENOL) 325 MG tablet Take 650 mg by mouth every 6 (six) hours as needed.  Marland Kitchen alendronate (FOSAMAX) 70 MG tablet Take 70 mg by mouth once a week.  . Cholecalciferol (VITAMIN D3 PO) Take 1 tablet by mouth daily.  . SYSTANE ULTRA 0.4-0.3 % SOLN SMARTSIG:1 Drop(s) In Eye(s) As Needed     Allergies:   Codeine and Penicillins   Social History   Socioeconomic History  . Marital status: Married    Spouse name: Not on file  . Number of children: Not on file  . Years of education: Not on file  . Highest education level: Not on file  Occupational History  . Not on file  Tobacco Use  . Smoking status: Never Smoker  .  Smokeless tobacco: Never Used  Vaping Use  . Vaping Use: Never used  Substance and Sexual Activity  . Alcohol use: No  . Drug use: No  . Sexual activity: Not on file  Other Topics Concern  . Not on file  Social History Narrative  . Not on file   Social Determinants of Health   Financial Resource Strain: Not on file  Food Insecurity: Not on file  Transportation Needs: Not on file  Physical Activity: Not on file  Stress: Not on file  Social Connections: Not on file     Family History: The patient's family history includes COPD in her mother; CVA in her father; Emphysema in her mother; Heart failure in her father; Pneumonia in her sister. There is no history of Breast cancer.  ROS:   Please see the history of present illness.    ROS  All other systems reviewed and negative.   EKGs/Labs/Other Studies Reviewed:    The following studies were reviewed today: none  EKG:  EKG is  ordered today.  The ekg ordered today demonstrates sinus bradycardia with no ST changes  Recent Labs: No results found for requested labs within last 8760 hours.   Recent Lipid Panel No results found for: CHOL, TRIG, HDL, CHOLHDL, VLDL, LDLCALC,  LDLDIRECT  Physical Exam:    VS:  BP (!) 144/70   Pulse (!) 59   Ht 5\' 7"  (1.702 m)   Wt 150 lb (68 kg)   BMI 23.49 kg/m     Wt Readings from Last 3 Encounters:  12/03/20 150 lb (68 kg)  10/31/19 140 lb 9.6 oz (63.8 kg)  10/03/18 140 lb 9.6 oz (63.8 kg)     GEN: Well nourished, well developed in no acute distress HEENT: Normal NECK: No JVD; No carotid bruits LYMPHATICS: No lymphadenopathy CARDIAC:RRR, no murmurs, rubs, gallops RESPIRATORY:  Clear to auscultation without rales, wheezing or rhonchi  ABDOMEN: Soft, non-tender, non-distended MUSCULOSKELETAL:  No edema; No deformity  SKIN: Warm and dry NEUROLOGIC:  Alert and oriented x 3 PSYCHIATRIC:  Normal affect    ASSESSMENT:    1. Rheumatic pulmonary valve stenosis   2. Benign  essential HTN    PLAN:    In order of problems listed above:  1.  Rheumatic pulmonic stenosis -2D echo  10/2019 showed Moderate pulmonic stenosis with Vmax 3.7 m/s, peak/mean gradient 55/28  mmHG. Normal RV function and TR jet insufficient for RVSP assessment. Unchanged from 10/08/2018.  -I will repeat a 2D echo to make sure this is stable  2.  HTN -BP borderline controlled on exam today but is very stressed taking care of her husband -she is not on BP meds    Medication Adjustments/Labs and Tests Ordered: Current medicines are reviewed at length with the patient today.  Concerns regarding medicines are outlined above.  Orders Placed This Encounter  Procedures  . EKG 12-Lead   No orders of the defined types were placed in this encounter.   Signed, Fransico Him, MD  12/03/2020 12:01 PM    [place]

## 2020-12-03 NOTE — Addendum Note (Signed)
Addended by: Antonieta Iba on: 12/03/2020 12:06 PM   Modules accepted: Orders

## 2020-12-27 ENCOUNTER — Other Ambulatory Visit: Payer: Self-pay

## 2020-12-27 ENCOUNTER — Encounter: Payer: Self-pay | Admitting: Cardiology

## 2020-12-27 ENCOUNTER — Ambulatory Visit (HOSPITAL_COMMUNITY): Payer: Medicare Other | Attending: Cardiovascular Disease

## 2020-12-27 DIAGNOSIS — I0989 Other specified rheumatic heart diseases: Secondary | ICD-10-CM | POA: Diagnosis not present

## 2020-12-27 LAB — ECHOCARDIOGRAM COMPLETE
Area-P 1/2: 3.42 cm2
P 1/2 time: 514 msec
S' Lateral: 2.7 cm

## 2021-03-03 ENCOUNTER — Other Ambulatory Visit: Payer: Medicare Other

## 2021-03-07 ENCOUNTER — Other Ambulatory Visit: Payer: Medicare Other

## 2021-03-07 ENCOUNTER — Ambulatory Visit: Payer: Medicare Other

## 2021-04-27 ENCOUNTER — Other Ambulatory Visit: Payer: Self-pay

## 2021-04-27 ENCOUNTER — Other Ambulatory Visit: Payer: Self-pay | Admitting: Family Medicine

## 2021-04-27 ENCOUNTER — Ambulatory Visit
Admission: RE | Admit: 2021-04-27 | Discharge: 2021-04-27 | Disposition: A | Discharge status: Home / Self Care | Payer: Medicare Other | Source: Ambulatory Visit | Attending: Family Medicine | Admitting: Family Medicine

## 2021-04-27 DIAGNOSIS — Z1231 Encounter for screening mammogram for malignant neoplasm of breast: Secondary | ICD-10-CM

## 2021-07-08 DIAGNOSIS — H43393 Other vitreous opacities, bilateral: Secondary | ICD-10-CM | POA: Diagnosis not present

## 2021-07-08 DIAGNOSIS — H40013 Open angle with borderline findings, low risk, bilateral: Secondary | ICD-10-CM | POA: Diagnosis not present

## 2021-07-08 DIAGNOSIS — H35361 Drusen (degenerative) of macula, right eye: Secondary | ICD-10-CM | POA: Diagnosis not present

## 2021-07-08 DIAGNOSIS — H524 Presbyopia: Secondary | ICD-10-CM | POA: Diagnosis not present

## 2021-07-08 DIAGNOSIS — H353132 Nonexudative age-related macular degeneration, bilateral, intermediate dry stage: Secondary | ICD-10-CM | POA: Diagnosis not present

## 2021-08-11 DIAGNOSIS — N1831 Chronic kidney disease, stage 3a: Secondary | ICD-10-CM | POA: Diagnosis not present

## 2021-08-11 DIAGNOSIS — M810 Age-related osteoporosis without current pathological fracture: Secondary | ICD-10-CM | POA: Diagnosis not present

## 2021-08-19 ENCOUNTER — Ambulatory Visit
Admission: RE | Admit: 2021-08-19 | Discharge: 2021-08-19 | Disposition: A | Discharge status: Home / Self Care | Payer: Medicare Other | Source: Ambulatory Visit | Attending: Family Medicine | Admitting: Family Medicine

## 2021-08-19 ENCOUNTER — Other Ambulatory Visit: Payer: Self-pay

## 2021-08-19 DIAGNOSIS — M810 Age-related osteoporosis without current pathological fracture: Secondary | ICD-10-CM

## 2021-08-19 DIAGNOSIS — M85851 Other specified disorders of bone density and structure, right thigh: Secondary | ICD-10-CM | POA: Diagnosis not present

## 2021-09-19 DIAGNOSIS — L821 Other seborrheic keratosis: Secondary | ICD-10-CM | POA: Diagnosis not present

## 2021-09-19 DIAGNOSIS — L820 Inflamed seborrheic keratosis: Secondary | ICD-10-CM | POA: Diagnosis not present

## 2021-09-19 DIAGNOSIS — L570 Actinic keratosis: Secondary | ICD-10-CM | POA: Diagnosis not present

## 2021-09-19 DIAGNOSIS — Z85828 Personal history of other malignant neoplasm of skin: Secondary | ICD-10-CM | POA: Diagnosis not present

## 2021-09-19 DIAGNOSIS — L814 Other melanin hyperpigmentation: Secondary | ICD-10-CM | POA: Diagnosis not present

## 2021-09-19 DIAGNOSIS — D045 Carcinoma in situ of skin of trunk: Secondary | ICD-10-CM | POA: Diagnosis not present

## 2021-09-19 DIAGNOSIS — B078 Other viral warts: Secondary | ICD-10-CM | POA: Diagnosis not present

## 2021-11-08 DIAGNOSIS — E559 Vitamin D deficiency, unspecified: Secondary | ICD-10-CM | POA: Diagnosis not present

## 2021-11-08 DIAGNOSIS — I10 Essential (primary) hypertension: Secondary | ICD-10-CM | POA: Diagnosis not present

## 2021-11-08 DIAGNOSIS — Z0000 Encounter for general adult medical examination without abnormal findings: Secondary | ICD-10-CM | POA: Diagnosis not present

## 2021-11-08 DIAGNOSIS — E785 Hyperlipidemia, unspecified: Secondary | ICD-10-CM | POA: Diagnosis not present

## 2021-11-08 DIAGNOSIS — M810 Age-related osteoporosis without current pathological fracture: Secondary | ICD-10-CM | POA: Diagnosis not present

## 2021-11-08 DIAGNOSIS — N1831 Chronic kidney disease, stage 3a: Secondary | ICD-10-CM | POA: Diagnosis not present

## 2021-11-15 DIAGNOSIS — M810 Age-related osteoporosis without current pathological fracture: Secondary | ICD-10-CM | POA: Diagnosis not present

## 2021-11-15 DIAGNOSIS — E559 Vitamin D deficiency, unspecified: Secondary | ICD-10-CM | POA: Diagnosis not present

## 2021-12-26 DIAGNOSIS — B078 Other viral warts: Secondary | ICD-10-CM | POA: Diagnosis not present

## 2022-01-02 ENCOUNTER — Ambulatory Visit: Payer: Medicare Other | Admitting: Cardiology

## 2022-01-03 ENCOUNTER — Other Ambulatory Visit: Payer: Self-pay

## 2022-01-03 ENCOUNTER — Encounter: Payer: Self-pay | Admitting: Cardiology

## 2022-01-03 ENCOUNTER — Ambulatory Visit: Payer: Medicare Other | Admitting: Cardiology

## 2022-01-03 VITALS — BP 124/56 | HR 63 | Ht 67.0 in | Wt 146.4 lb

## 2022-01-03 DIAGNOSIS — I370 Nonrheumatic pulmonary valve stenosis: Secondary | ICD-10-CM

## 2022-01-03 DIAGNOSIS — I10 Essential (primary) hypertension: Secondary | ICD-10-CM | POA: Diagnosis not present

## 2022-01-03 NOTE — Progress Notes (Addendum)
Cardiology Office Note:    Date:  01/03/2022   ID:  Carrie Spencer, DOB 03-21-1934, MRN [id number]  PCP:  Cari Caraway, MD  Cardiologist:  Fransico Him, MD    Referring MD: No ref. provider found   Chief Complaint  Patient presents with   Follow-up    Pulmonic stenosis and hypertension    History of Present Illness:    Carrie Spencer is a 88 y.o. female with a hx of rheumatic fever, diastolic dysfunction and pulmonic stenosis.  She is here today for followup and is doing well.  She denies any chest pain or pressure, SOB, DOE, PND, orthopnea, LE edema, dizziness, palpitations or syncope.  She is compliant with her meds and is tolerating meds with no SE.     Past Medical History:  Diagnosis Date   Diastolic dysfunction    DJD (degenerative joint disease) of cervical spine    Osteopenia    Pulmonic stenosis 01/15/2015   moderate with mean PVG 13mmHg on echo 11/2020   Rheumatic fever 01/15/2015   Vitamin D deficiency     Past Surgical History:  Procedure Laterality Date   BREAST BIOPSY     BREAST EXCISIONAL BIOPSY Bilateral    LAPAROSCOPIC CHOLECYSTECTOMY  2009   PARTIAL HYSTERECTOMY      Current Medications: Current Meds  Medication Sig   acetaminophen (TYLENOL) 325 MG tablet Take 650 mg by mouth every 6 (six) hours as needed.   alendronate (FOSAMAX) 70 MG tablet Take 70 mg by mouth once a week.   Cholecalciferol (VITAMIN D3 PO) Take 1 tablet by mouth daily.   SYSTANE ULTRA 0.4-0.3 % SOLN SMARTSIG:1 Drop(s) In Eye(s) As Needed     Allergies:   Codeine and Penicillins   Social History   Socioeconomic History   Marital status: Married    Spouse name: Not on file   Number of children: Not on file   Years of education: Not on file   Highest education level: Not on file  Occupational History   Not on file  Tobacco Use   Smoking status: Never   Smokeless tobacco: Never  Vaping Use   Vaping Use: Never used  Substance and Sexual Activity   Alcohol use: No    Drug use: No   Sexual activity: Not on file  Other Topics Concern   Not on file  Social History Narrative   Not on file   Social Determinants of Health   Financial Resource Strain: Not on file  Food Insecurity: Not on file  Transportation Needs: Not on file  Physical Activity: Not on file  Stress: Not on file  Social Connections: Not on file     Family History: The patient's family history includes COPD in her mother; CVA in her father; Emphysema in her mother; Heart failure in her father; Pneumonia in her sister. There is no history of Breast cancer.  ROS:   Please see the history of present illness.    ROS  All other systems reviewed and negative.   EKGs/Labs/Other Studies Reviewed:    The following studies were reviewed today: none  EKG:  EKG is  ordered today.  The ekg ordered today demonstrates NSR with 1st degree AVB  Recent Labs: No results found for requested labs within last 8760 hours.   Recent Lipid Panel No results found for: CHOL, TRIG, HDL, CHOLHDL, VLDL, LDLCALC, LDLDIRECT  Physical Exam:    VS:  BP (!) 124/56    Pulse 63  Ht 5\' 7"  (1.702 m)    Wt 146 lb 6.4 oz (66.4 kg)    SpO2 90%    BMI 22.93 kg/m     Wt Readings from Last 3 Encounters:  01/03/22 146 lb 6.4 oz (66.4 kg)  12/03/20 150 lb (68 kg)  10/31/19 140 lb 9.6 oz (63.8 kg)    GEN: Well nourished, well developed in no acute distress HEENT: Normal NECK: No JVD; No carotid bruits LYMPHATICS: No lymphadenopathy CARDIAC:RRR, no rubs, gallops.  2/6 SM at RUSB>LUSB RESPIRATORY:  Clear to auscultation without rales, wheezing or rhonchi  ABDOMEN: Soft, non-tender, non-distended MUSCULOSKELETAL:  No edema; No deformity  SKIN: Warm and dry NEUROLOGIC:  Alert and oriented x 3 PSYCHIATRIC:  Normal affect   ASSESSMENT:    1. Nonrheumatic pulmonary valve stenosis   2. Benign essential HTN    PLAN:    In order of problems listed above:  1.  Rheumatic pulmonic stenosis -2D echo 11/2020  showed normal LV function with EF 60 to 65% with normal RV size and systolic function, mild RVH, moderate pulmonic stenosis with mean pulmonary valve gradient 30 mmHg.  This was stable from echo 2020 -Repeat 2D echocardiogram to make sure pulmonic stenosis has not progressed  2.  HTN -BP is well controlled on exam today -She has not required any antihypertensive therapy   Medication Adjustments/Labs and Tests Ordered: Current medicines are reviewed at length with the patient today.  Concerns regarding medicines are outlined above.  Orders Placed This Encounter  Procedures   EKG 12-Lead   No orders of the defined types were placed in this encounter.   Signed, Fransico Him, MD  01/03/2022 10:00 AM    [place]

## 2022-01-03 NOTE — Patient Instructions (Signed)
Medication Instructions:  Your physician recommends that you continue on your current medications as directed. Please refer to the Current Medication list given to you today.  *If you need a refill on your cardiac medications before your next appointment, please call your pharmacy*  Testing/Procedures: Your physician has requested that you have an echocardiogram. Echocardiography is a painless test that uses sound waves to create images of your heart. It provides your doctor with information about the size and shape of your heart and how well your hearts chambers and valves are working. This procedure takes approximately one hour. There are no restrictions for this procedure.    Follow-Up: At [facility], you and your health needs are our priority.  As part of our continuing mission to provide you with exceptional heart care, we have created designated Provider Care Teams.  These Care Teams include your primary Cardiologist (physician) and Advanced Practice Providers (APPs -  Physician Assistants and Nurse Practitioners) who all work together to provide you with the care you need, when you need it.   Your next appointment:   1 year(s)  The format for your next appointment:   In Person  Provider:   Fransico Him, MD

## 2022-01-03 NOTE — Addendum Note (Signed)
Addended by: Molli Barrows on: 01/03/2022 10:07 AM   Modules accepted: Orders

## 2022-01-19 ENCOUNTER — Ambulatory Visit (HOSPITAL_COMMUNITY): Payer: Medicare Other | Attending: Cardiology

## 2022-01-19 ENCOUNTER — Encounter: Payer: Self-pay | Admitting: Cardiology

## 2022-01-19 ENCOUNTER — Other Ambulatory Visit: Payer: Self-pay

## 2022-01-19 DIAGNOSIS — I370 Nonrheumatic pulmonary valve stenosis: Secondary | ICD-10-CM | POA: Diagnosis not present

## 2022-01-19 DIAGNOSIS — I351 Nonrheumatic aortic (valve) insufficiency: Secondary | ICD-10-CM | POA: Insufficient documentation

## 2022-01-19 DIAGNOSIS — I371 Nonrheumatic pulmonary valve insufficiency: Secondary | ICD-10-CM | POA: Insufficient documentation

## 2022-01-19 LAB — ECHOCARDIOGRAM COMPLETE
Area-P 1/2: 3.03 cm2
P 1/2 time: 418 msec
S' Lateral: 2.6 cm

## 2022-01-20 ENCOUNTER — Telehealth: Payer: Self-pay

## 2022-01-20 DIAGNOSIS — I351 Nonrheumatic aortic (valve) insufficiency: Secondary | ICD-10-CM

## 2022-01-20 DIAGNOSIS — I370 Nonrheumatic pulmonary valve stenosis: Secondary | ICD-10-CM

## 2022-01-20 DIAGNOSIS — I371 Nonrheumatic pulmonary valve insufficiency: Secondary | ICD-10-CM

## 2022-01-20 NOTE — Telephone Encounter (Signed)
-----   Message from Sueanne Margarita, MD sent at 01/19/2022  4:36 PM EST ----- Echo showed normal heart function with increased stiffness of the heart muscle normal for her age with moderate leakiness of the aortic valve, moderate pulmonic stenosis and mild to moderate leakiness of the pulmonic valve.  There is slight increase in the degree of pulmonic stenosis and the leaky aortic valve has progressed.  Please repeat 2D echo in 6 months for aortic insufficiency and pulmonic stenosis

## 2022-01-20 NOTE — Telephone Encounter (Signed)
The patient has been notified of the result and verbalized understanding.  All questions (if any) were answered. Antonieta Iba, RN 01/20/2022 3:45 PM  Echocardiogram has been ordered.

## 2022-01-23 DIAGNOSIS — S39011A Strain of muscle, fascia and tendon of abdomen, initial encounter: Secondary | ICD-10-CM | POA: Diagnosis not present

## 2022-01-23 DIAGNOSIS — R109 Unspecified abdominal pain: Secondary | ICD-10-CM | POA: Diagnosis not present

## 2022-01-26 DIAGNOSIS — R109 Unspecified abdominal pain: Secondary | ICD-10-CM | POA: Diagnosis not present

## 2022-01-26 DIAGNOSIS — M5430 Sciatica, unspecified side: Secondary | ICD-10-CM | POA: Diagnosis not present

## 2022-02-17 DIAGNOSIS — M545 Low back pain, unspecified: Secondary | ICD-10-CM | POA: Diagnosis not present

## 2022-02-26 DIAGNOSIS — M545 Low back pain, unspecified: Secondary | ICD-10-CM | POA: Diagnosis not present

## 2022-03-02 DIAGNOSIS — M545 Low back pain, unspecified: Secondary | ICD-10-CM | POA: Diagnosis not present

## 2022-03-17 DIAGNOSIS — R059 Cough, unspecified: Secondary | ICD-10-CM | POA: Diagnosis not present

## 2022-03-17 DIAGNOSIS — Z20822 Contact with and (suspected) exposure to covid-19: Secondary | ICD-10-CM | POA: Diagnosis not present

## 2022-04-04 ENCOUNTER — Other Ambulatory Visit: Payer: Self-pay | Admitting: Family Medicine

## 2022-04-04 DIAGNOSIS — Z1231 Encounter for screening mammogram for malignant neoplasm of breast: Secondary | ICD-10-CM

## 2022-04-28 ENCOUNTER — Ambulatory Visit: Payer: Medicare Other

## 2022-05-05 ENCOUNTER — Ambulatory Visit
Admission: RE | Admit: 2022-05-05 | Discharge: 2022-05-05 | Disposition: A | Discharge status: Home / Self Care | Payer: Medicare Other | Source: Ambulatory Visit | Attending: Family Medicine | Admitting: Family Medicine

## 2022-05-05 DIAGNOSIS — Z1231 Encounter for screening mammogram for malignant neoplasm of breast: Secondary | ICD-10-CM

## 2022-05-09 ENCOUNTER — Other Ambulatory Visit: Payer: Self-pay | Admitting: Family Medicine

## 2022-05-09 DIAGNOSIS — R928 Other abnormal and inconclusive findings on diagnostic imaging of breast: Secondary | ICD-10-CM

## 2022-05-24 ENCOUNTER — Ambulatory Visit: Payer: Medicare Other

## 2022-05-24 ENCOUNTER — Ambulatory Visit
Admission: RE | Admit: 2022-05-24 | Discharge: 2022-05-24 | Disposition: A | Discharge status: Home / Self Care | Payer: Medicare Other | Source: Ambulatory Visit | Attending: Family Medicine | Admitting: Family Medicine

## 2022-05-24 DIAGNOSIS — R928 Other abnormal and inconclusive findings on diagnostic imaging of breast: Secondary | ICD-10-CM

## 2022-05-24 DIAGNOSIS — R922 Inconclusive mammogram: Secondary | ICD-10-CM | POA: Diagnosis not present

## 2022-07-21 ENCOUNTER — Ambulatory Visit (HOSPITAL_COMMUNITY): Payer: Medicare Other | Attending: Cardiology

## 2022-07-21 DIAGNOSIS — I370 Nonrheumatic pulmonary valve stenosis: Secondary | ICD-10-CM | POA: Diagnosis not present

## 2022-07-21 DIAGNOSIS — I371 Nonrheumatic pulmonary valve insufficiency: Secondary | ICD-10-CM | POA: Diagnosis not present

## 2022-07-21 DIAGNOSIS — I351 Nonrheumatic aortic (valve) insufficiency: Secondary | ICD-10-CM | POA: Diagnosis not present

## 2022-07-21 LAB — ECHOCARDIOGRAM COMPLETE
Area-P 1/2: 2.73 cm2
S' Lateral: 2.5 cm

## 2022-07-24 ENCOUNTER — Encounter: Payer: Self-pay | Admitting: Cardiology

## 2022-07-26 ENCOUNTER — Telehealth: Payer: Self-pay

## 2022-07-26 DIAGNOSIS — I370 Nonrheumatic pulmonary valve stenosis: Secondary | ICD-10-CM

## 2022-07-26 DIAGNOSIS — I371 Nonrheumatic pulmonary valve insufficiency: Secondary | ICD-10-CM

## 2022-07-26 NOTE — Telephone Encounter (Signed)
-----   Message from Sueanne Margarita, MD sent at 07/24/2022  5:11 PM EDT ----- Echo showed normal heart function with mildly and stiff heart muscle with mildly leaky AV and severe pulmonic stenosis.  Please refer to structural heart team for further evaluation of severe PS

## 2022-07-26 NOTE — Telephone Encounter (Signed)
The patient has been notified of the result and verbalized understanding.  All questions (if any) were answered. Antonieta Iba, RN 07/26/2022 3:44 PM  Referral has been placed.

## 2022-08-01 ENCOUNTER — Telehealth: Payer: Self-pay

## 2022-08-01 DIAGNOSIS — I370 Nonrheumatic pulmonary valve stenosis: Secondary | ICD-10-CM

## 2022-08-01 NOTE — Telephone Encounter (Signed)
-----   Message from Sueanne Margarita, MD sent at 08/01/2022 12:23 PM EDT ----- Regarding: RE: Discuss pt with Dr Radford Pax Thanks so much I will order the MRI  Shayde Gervacio please order a cardiac MRI with gad for pulmonic stenosis  Traci ----- Message ----- From: Sherren Mocha, MD Sent: 08/01/2022  12:10 PM EDT To: Barkley Boards, RN; Sueanne Margarita, MD; # Subject: RE: Discuss pt with Dr Glenda Chroman - we reviewed her case this morning in our meeting. At 55 with normal RV function, there isn't much we're likely to do with her. We thought it might be appropriate to order a CMR study to better understand her anatomy if you want to order that. I'd be happy to look at that with you once it's done.   Thx Ronalee Belts ----- Message ----- From: Barkley Boards, RN Sent: 08/01/2022   9:06 AM EDT To: Sueanne Margarita, MD; Sherren Mocha, MD; # Subject: Discuss pt with Dr Virginia Rochester Dr Burt Knack,   This is the Dr Radford Pax pt with severe pulmonic stenosis that we discussed in our morning meeting.  Please discuss next steps with Dr Radford Pax.  I will cancel her structural heart referral at this time.  Thanks, Ander Purpura

## 2022-08-01 NOTE — Telephone Encounter (Signed)
Cardiac MRI has been ordered. Patient is aware

## 2022-08-08 ENCOUNTER — Telehealth: Payer: Self-pay | Admitting: Cardiology

## 2022-08-08 NOTE — Telephone Encounter (Signed)
Spoke with the patient who is wondering if she could have her MRI done sooner. She states that it is just worrying her and she doesn't want to wait until November. Advised that I would send a message to schedulers to see if there were any sooner openings.

## 2022-08-08 NOTE — Telephone Encounter (Signed)
Patient called concerned that her MRI is scheduled in November and she wants to know if she should be restricting her activities.  Patient stated she would like to get this procedure moved up if possible.

## 2022-08-23 DIAGNOSIS — H353132 Nonexudative age-related macular degeneration, bilateral, intermediate dry stage: Secondary | ICD-10-CM | POA: Diagnosis not present

## 2022-08-23 DIAGNOSIS — H40013 Open angle with borderline findings, low risk, bilateral: Secondary | ICD-10-CM | POA: Diagnosis not present

## 2022-08-23 DIAGNOSIS — H43393 Other vitreous opacities, bilateral: Secondary | ICD-10-CM | POA: Diagnosis not present

## 2022-08-23 DIAGNOSIS — H35361 Drusen (degenerative) of macula, right eye: Secondary | ICD-10-CM | POA: Diagnosis not present

## 2022-08-23 DIAGNOSIS — H524 Presbyopia: Secondary | ICD-10-CM | POA: Diagnosis not present

## 2022-09-11 ENCOUNTER — Telehealth (HOSPITAL_COMMUNITY): Payer: Self-pay | Admitting: *Deleted

## 2022-09-11 ENCOUNTER — Other Ambulatory Visit (HOSPITAL_COMMUNITY): Payer: Self-pay | Admitting: *Deleted

## 2022-09-11 DIAGNOSIS — I370 Nonrheumatic pulmonary valve stenosis: Secondary | ICD-10-CM

## 2022-09-11 NOTE — Telephone Encounter (Signed)
Attempted to call patient regarding upcoming cardiac MRI appointment and to remind patient about lab work prior to appointment. Left message on voicemail with name and callback number  Gordy Clement RN Navigator Cardiac [facility] [phone] Office [phone] Cell

## 2022-09-11 NOTE — Telephone Encounter (Signed)
Patient returning call regarding upcoming cardiac imaging study; pt verbalizes understanding of appt date/time, parking situation and where to check in,  and verified current allergies; name and call back number provided for further questions should they arise  Gordy Clement RN Navigator Cardiac Imaging [facility] [phone] office [phone] cell  Patient aware to obtain labs prior to cardiac MRI. She denies claustrophobia or metal.

## 2022-09-12 LAB — CBC
Hematocrit: 33.2 % — ABNORMAL LOW (ref 34.0–46.6)
Hemoglobin: 10.9 g/dL — ABNORMAL LOW (ref 11.1–15.9)
MCH: 30 pg (ref 26.6–33.0)
MCHC: 32.8 g/dL (ref 31.5–35.7)
MCV: 92 fL (ref 79–97)
Platelets: 194 10*3/uL (ref 150–450)
RBC: 3.63 x10E6/uL — ABNORMAL LOW (ref 3.77–5.28)
RDW: 14.2 % (ref 11.7–15.4)
WBC: 4.3 10*3/uL (ref 3.4–10.8)

## 2022-09-13 ENCOUNTER — Other Ambulatory Visit: Payer: Self-pay | Admitting: Cardiology

## 2022-09-13 ENCOUNTER — Ambulatory Visit (HOSPITAL_COMMUNITY)
Admission: RE | Admit: 2022-09-13 | Discharge: 2022-09-13 | Disposition: A | Discharge status: Home / Self Care | Payer: Medicare Other | Source: Ambulatory Visit | Attending: Cardiology | Admitting: Cardiology

## 2022-09-13 DIAGNOSIS — I370 Nonrheumatic pulmonary valve stenosis: Secondary | ICD-10-CM | POA: Diagnosis not present

## 2022-09-13 MED ORDER — GADOBUTROL 1 MMOL/ML IV SOLN
8.0000 mL | Freq: Once | INTRAVENOUS | Status: AC | PRN
  Administered 2022-09-13: 8 mL via INTRAVENOUS

## 2022-09-19 ENCOUNTER — Telehealth: Payer: Self-pay

## 2022-09-19 DIAGNOSIS — I371 Nonrheumatic pulmonary valve insufficiency: Secondary | ICD-10-CM

## 2022-09-19 DIAGNOSIS — I370 Nonrheumatic pulmonary valve stenosis: Secondary | ICD-10-CM

## 2022-09-19 NOTE — Telephone Encounter (Signed)
The patient has been notified of the result and verbalized understanding.  All questions (if any) were answered. Bernestine Amass, RN 09/19/2022 4:55 PM

## 2022-09-19 NOTE — Telephone Encounter (Signed)
-----   Message from Sueanne Margarita, MD sent at 09/19/2022 12:12 PM EDT ----- Repeat 2D echo in 1 year ----- Message ----- From: Sherren Mocha, MD Sent: 09/19/2022  11:32 AM EDT To: Sueanne Margarita, MD; Freada Bergeron, MD  Study reviewed.  Known severe pulmonic stenosis with normal RV size and wall thickness by cardiac MR.  Patient with minimal symptoms.  At age 88, ongoing surveillance is appropriate.  Would not recommend intervention.

## 2022-09-20 DIAGNOSIS — N1831 Chronic kidney disease, stage 3a: Secondary | ICD-10-CM | POA: Diagnosis not present

## 2022-09-20 DIAGNOSIS — Z23 Encounter for immunization: Secondary | ICD-10-CM | POA: Diagnosis not present

## 2022-09-20 DIAGNOSIS — D649 Anemia, unspecified: Secondary | ICD-10-CM | POA: Diagnosis not present

## 2022-09-20 DIAGNOSIS — E559 Vitamin D deficiency, unspecified: Secondary | ICD-10-CM | POA: Diagnosis not present

## 2022-09-20 DIAGNOSIS — M810 Age-related osteoporosis without current pathological fracture: Secondary | ICD-10-CM | POA: Diagnosis not present

## 2022-09-28 DIAGNOSIS — D692 Other nonthrombocytopenic purpura: Secondary | ICD-10-CM | POA: Diagnosis not present

## 2022-09-28 DIAGNOSIS — L814 Other melanin hyperpigmentation: Secondary | ICD-10-CM | POA: Diagnosis not present

## 2022-09-28 DIAGNOSIS — L821 Other seborrheic keratosis: Secondary | ICD-10-CM | POA: Diagnosis not present

## 2022-09-28 DIAGNOSIS — L570 Actinic keratosis: Secondary | ICD-10-CM | POA: Diagnosis not present

## 2022-09-28 DIAGNOSIS — B078 Other viral warts: Secondary | ICD-10-CM | POA: Diagnosis not present

## 2022-09-28 DIAGNOSIS — D1801 Hemangioma of skin and subcutaneous tissue: Secondary | ICD-10-CM | POA: Diagnosis not present

## 2022-09-28 DIAGNOSIS — Z85828 Personal history of other malignant neoplasm of skin: Secondary | ICD-10-CM | POA: Diagnosis not present

## 2022-10-11 DIAGNOSIS — R1013 Epigastric pain: Secondary | ICD-10-CM | POA: Diagnosis not present

## 2022-10-11 DIAGNOSIS — K219 Gastro-esophageal reflux disease without esophagitis: Secondary | ICD-10-CM | POA: Diagnosis not present

## 2022-10-11 DIAGNOSIS — R030 Elevated blood-pressure reading, without diagnosis of hypertension: Secondary | ICD-10-CM | POA: Diagnosis not present

## 2022-10-23 ENCOUNTER — Other Ambulatory Visit (HOSPITAL_COMMUNITY): Payer: Medicare Other

## 2022-11-13 DIAGNOSIS — Z0000 Encounter for general adult medical examination without abnormal findings: Secondary | ICD-10-CM | POA: Diagnosis not present

## 2022-11-13 DIAGNOSIS — Z23 Encounter for immunization: Secondary | ICD-10-CM | POA: Diagnosis not present

## 2022-11-17 DIAGNOSIS — D649 Anemia, unspecified: Secondary | ICD-10-CM | POA: Diagnosis not present

## 2022-11-17 DIAGNOSIS — E785 Hyperlipidemia, unspecified: Secondary | ICD-10-CM | POA: Diagnosis not present

## 2022-11-17 DIAGNOSIS — Z0000 Encounter for general adult medical examination without abnormal findings: Secondary | ICD-10-CM | POA: Diagnosis not present

## 2022-11-17 DIAGNOSIS — N1831 Chronic kidney disease, stage 3a: Secondary | ICD-10-CM | POA: Diagnosis not present

## 2022-11-17 DIAGNOSIS — I10 Essential (primary) hypertension: Secondary | ICD-10-CM | POA: Diagnosis not present

## 2022-11-17 DIAGNOSIS — G4700 Insomnia, unspecified: Secondary | ICD-10-CM | POA: Diagnosis not present

## 2022-11-17 DIAGNOSIS — M810 Age-related osteoporosis without current pathological fracture: Secondary | ICD-10-CM | POA: Diagnosis not present

## 2023-02-02 IMAGING — MG MM DIGITAL SCREENING BILAT W/ TOMO AND CAD
8 series · 9 of 24 positions shown · non-contrast
Comparison: Previous exam(s).

CLINICAL DATA: Screening.

EXAM:
DIGITAL SCREENING BILATERAL MAMMOGRAM WITH TOMOSYNTHESIS AND CAD
TECHNIQUE: Bilateral screening digital craniocaudal and mediolateral oblique
mammograms were obtained. Bilateral screening digital breast
tomosynthesis was performed. The images were evaluated with
computer-aided detection.

[L CC synth-2D]
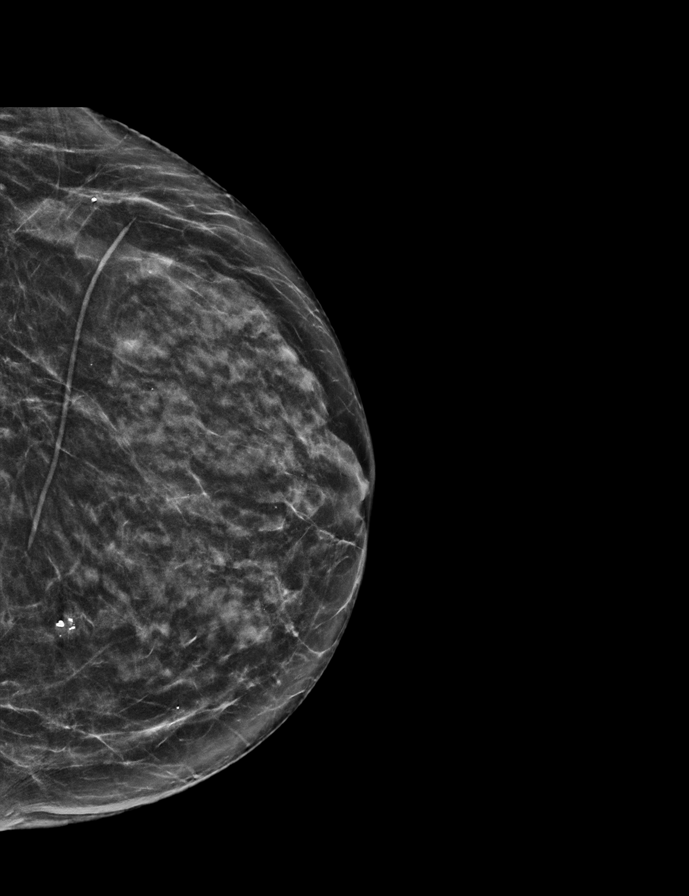

[R MLO synth-2D]
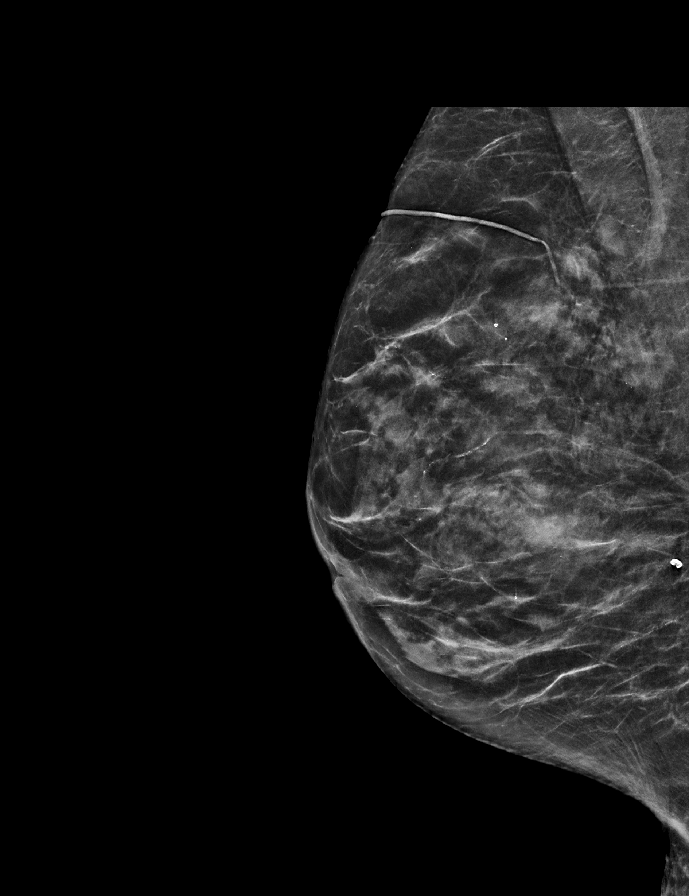

[R CC synth-2D]
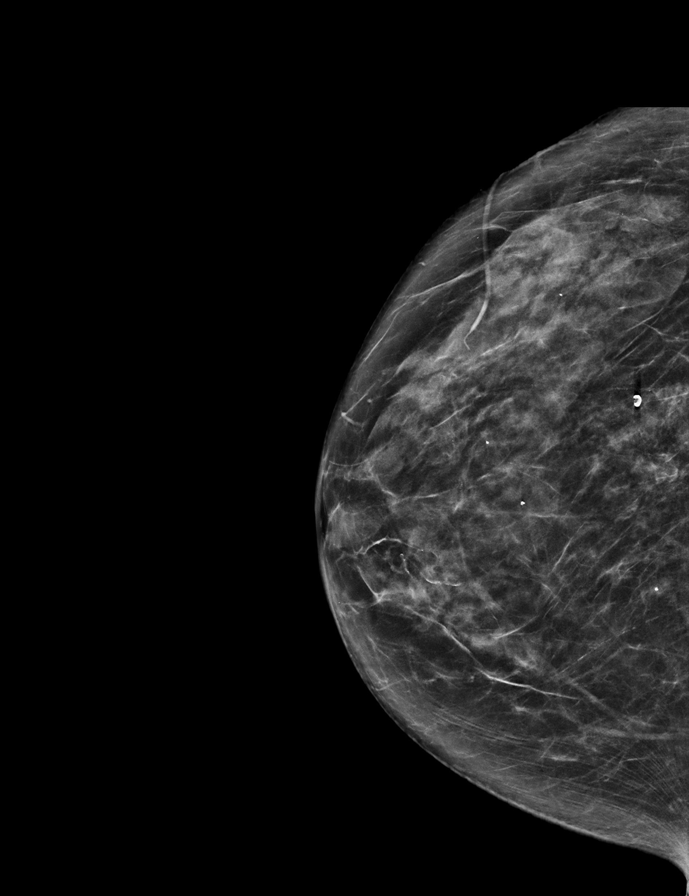

[L MLO synth-2D]
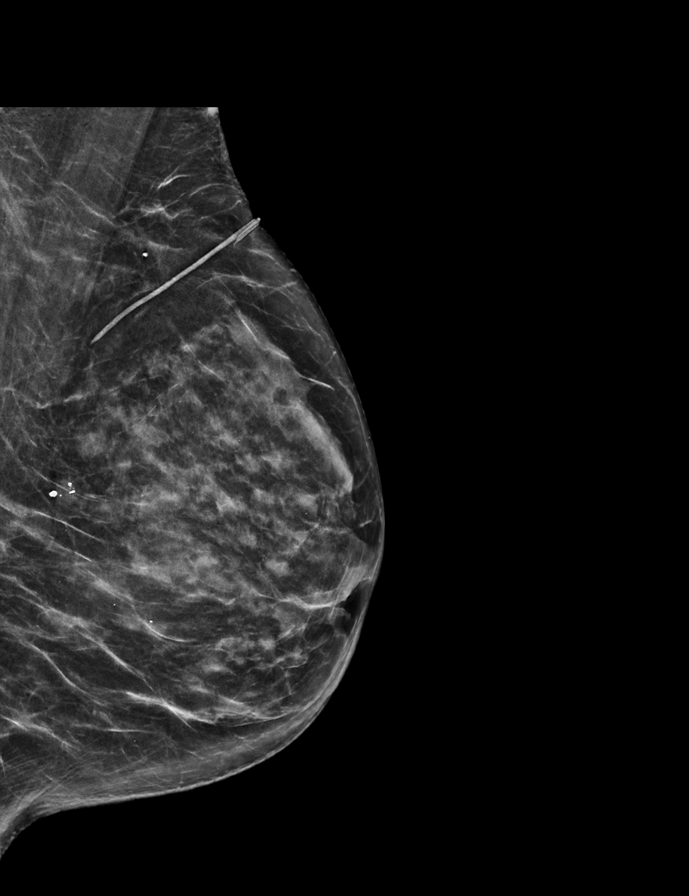

[L CC tomo · 2 of 55 frames shown]
[frame 18/55]
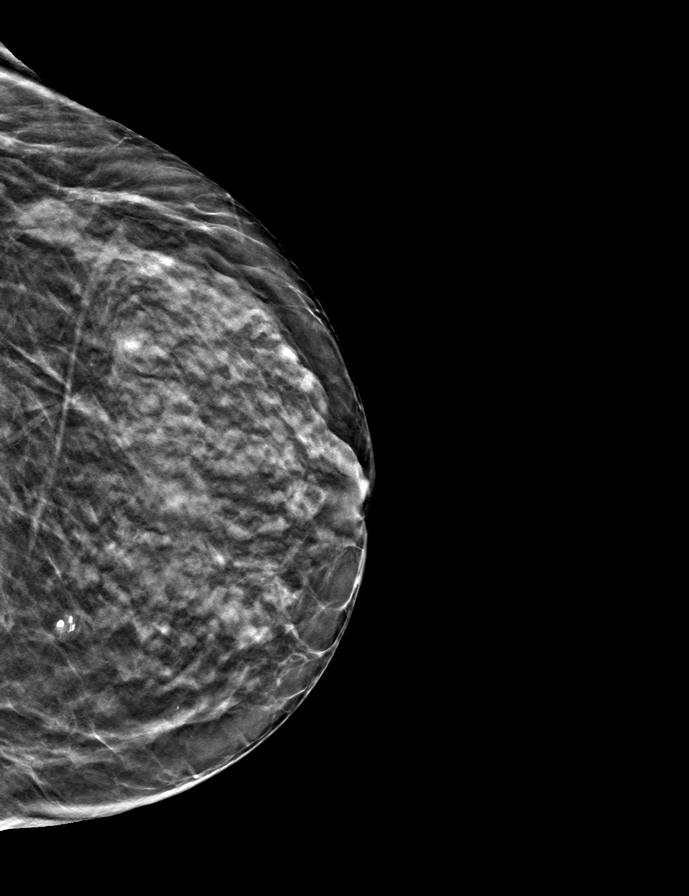
[frame 28/55]
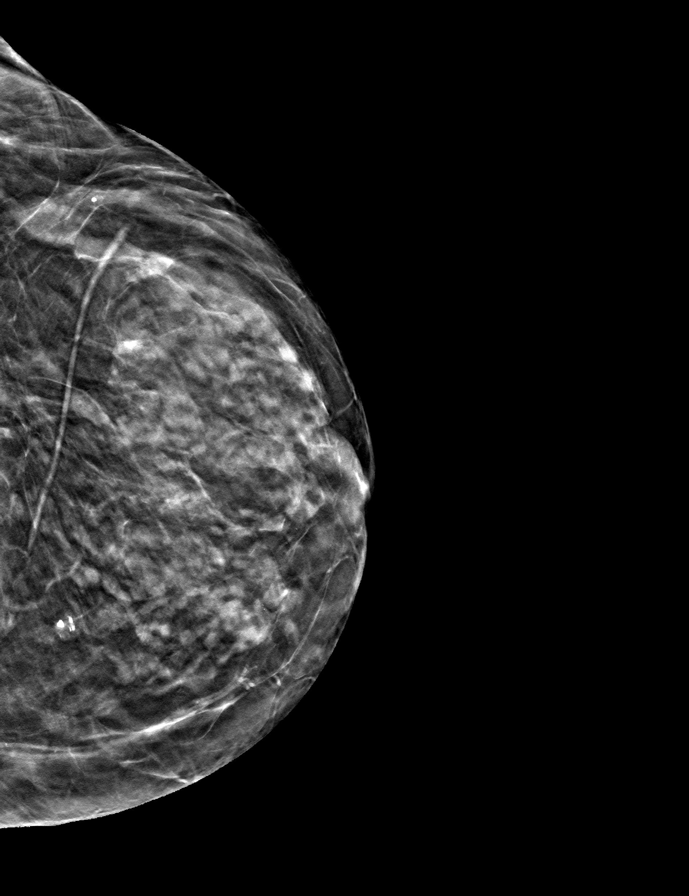

[L MLO tomo · tomo slice 29/58.0]
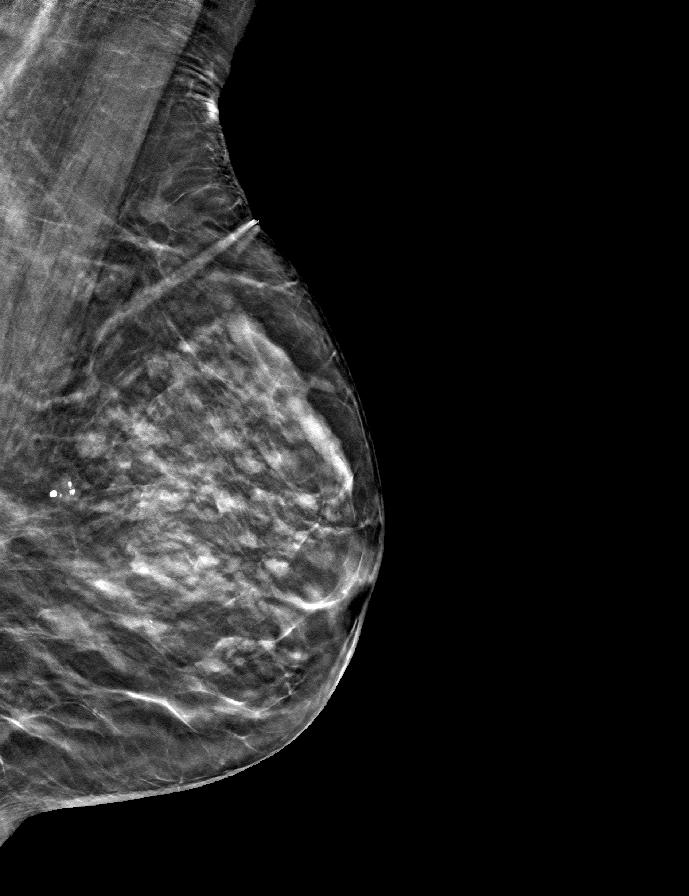

[R MLO tomo · tomo slice 30/59.0]
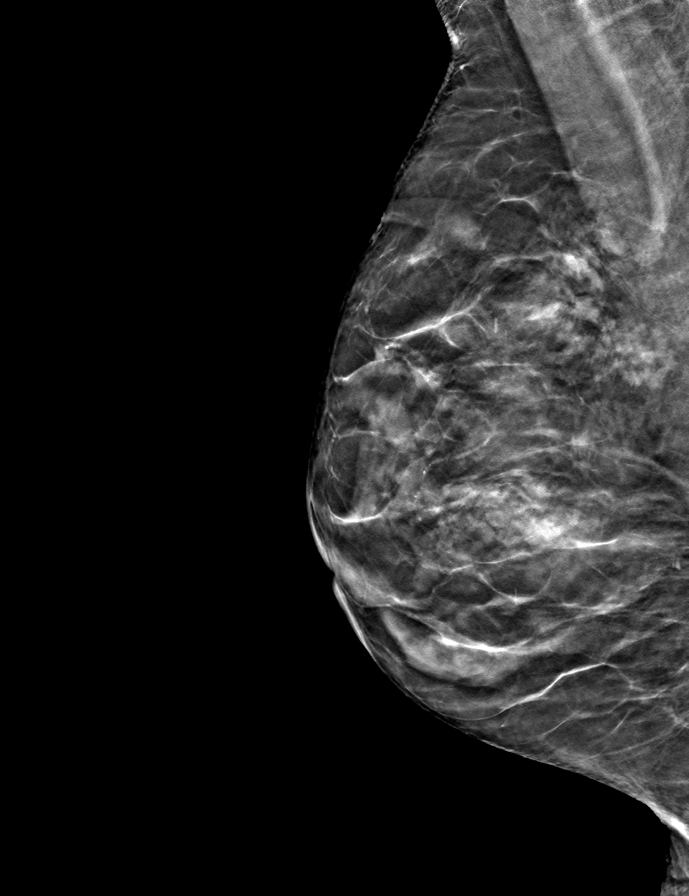

[R CC tomo · tomo slice 31/62.0]
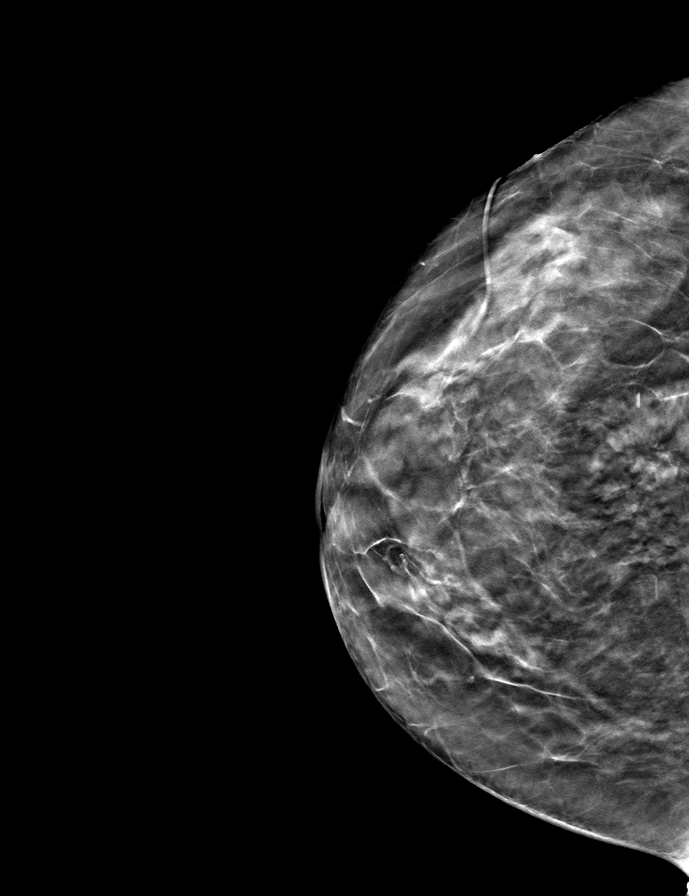

[9 of 24 positions shown; findings below may reference images not displayed]

ACR Breast Density Category c: The breast tissue is heterogeneously
dense, which may obscure small masses.
FINDINGS: In the left breast, a possible asymmetry warrants further
evaluation. In the right breast, no findings suspicious for
malignancy.
IMPRESSION: Further evaluation is suggested for possible asymmetry in the left
breast.

RECOMMENDATION:
Diagnostic mammogram and possibly ultrasound of the left breast.
(Code:[id number]-V-11X)

The patient will be contacted regarding the findings, and additional
imaging will be scheduled.

BI-RADS CATEGORY  0: Incomplete. Need additional imaging evaluation
and/or prior mammograms for comparison.

## 2023-02-21 ENCOUNTER — Ambulatory Visit: Payer: Medicare Other | Admitting: Podiatry

## 2023-02-21 DIAGNOSIS — M79674 Pain in right toe(s): Secondary | ICD-10-CM

## 2023-02-21 DIAGNOSIS — M79675 Pain in left toe(s): Secondary | ICD-10-CM | POA: Diagnosis not present

## 2023-02-21 DIAGNOSIS — B351 Tinea unguium: Secondary | ICD-10-CM

## 2023-02-21 NOTE — Progress Notes (Signed)
   Chief Complaint  Patient presents with   Ingrown Toenail    Patient came in today for ingrown toenail nail right hallux, patient denies any drainage, right hallux callus, routine foot care,     SUBJECTIVE Patient presents to office today complaining of elongated, thickened nails that cause pain while ambulating in shoes.  Patient is unable to trim their own nails.  Patient also has symptomatic calluses as well as bunions and hammertoes.  She is not interested in surgery but would like to discuss conservative measures patient is here for further evaluation and treatment.  Past Medical History:  Diagnosis Date   Aortic insufficiency    Mild by echo [id number]   Diastolic dysfunction    DJD (degenerative joint disease) of cervical spine    Osteopenia    Pulmonic regurgitation    Mild to moderate by echo 2/23   Pulmonic stenosis 01/15/2015   severe by echo 8/23   Rheumatic fever 01/15/2015   Vitamin D deficiency     Allergies  Allergen Reactions   Codeine Nausea Only   Penicillins Rash     OBJECTIVE General Patient is awake, alert, and oriented x 3 and in no acute distress. Derm Skin is dry and supple bilateral. Negative open lesions or macerations. Remaining integument unremarkable. Nails are tender, long, thickened and dystrophic with subungual debris, consistent with onychomycosis, 1-5 bilateral. No signs of infection noted.  Hyperkeratotic preulcerative callus tissue also noted to the bilateral feet Vasc  DP and PT pedal pulses palpable bilaterally. Temperature gradient within normal limits.  Neuro Epicritic and protective threshold sensation grossly intact bilaterally.  Musculoskeletal Exam No symptomatic pedal deformities noted bilateral. Muscular strength within normal limits.  ASSESSMENT 1.  Pain due to onychomycosis of toenails both 2.  Hallux valgus bilateral 3.  Hammertoes lesser digits bilateral  PLAN OF CARE 1. Patient evaluated today.  2. Instructed to maintain  good pedal hygiene and foot care.  3. Mechanical debridement of nails 1-5 bilaterally performed using a nail nipper. Filed with dremel without incident.  4.  Excisional debridement of the hyperkeratotic callus tissue was performed using a 312 scalpel without incident or bleeding.  Patient felt relief  5.  Silicone toe cushions and toe spacers were dispensed to alleviate pressure between the hammertoes  6. Recommend wide fitting shoes that do not irritate the forefoot 7. return to clinic in 3 mos.    Edrick Kins, DPM [facility]  Dr. Edrick Kins, DPM    [address], [place] [address]                Office [phone]  Fax [phone]

## 2023-02-27 DIAGNOSIS — H40013 Open angle with borderline findings, low risk, bilateral: Secondary | ICD-10-CM | POA: Diagnosis not present

## 2023-04-06 ENCOUNTER — Other Ambulatory Visit: Payer: Self-pay | Admitting: Family Medicine

## 2023-04-06 DIAGNOSIS — Z139 Encounter for screening, unspecified: Secondary | ICD-10-CM

## 2023-04-19 DIAGNOSIS — Z85828 Personal history of other malignant neoplasm of skin: Secondary | ICD-10-CM | POA: Diagnosis not present

## 2023-04-19 DIAGNOSIS — L820 Inflamed seborrheic keratosis: Secondary | ICD-10-CM | POA: Diagnosis not present

## 2023-04-19 DIAGNOSIS — D1801 Hemangioma of skin and subcutaneous tissue: Secondary | ICD-10-CM | POA: Diagnosis not present

## 2023-04-19 DIAGNOSIS — L309 Dermatitis, unspecified: Secondary | ICD-10-CM | POA: Diagnosis not present

## 2023-05-04 ENCOUNTER — Ambulatory Visit: Payer: Medicare Other | Admitting: Cardiology

## 2023-05-15 ENCOUNTER — Ambulatory Visit
Admission: RE | Admit: 2023-05-15 | Discharge: 2023-05-15 | Disposition: A | Discharge status: Home / Self Care | Payer: Medicare Other | Source: Ambulatory Visit | Attending: Family Medicine | Admitting: Family Medicine

## 2023-05-15 DIAGNOSIS — Z1231 Encounter for screening mammogram for malignant neoplasm of breast: Secondary | ICD-10-CM | POA: Diagnosis not present

## 2023-05-15 DIAGNOSIS — Z139 Encounter for screening, unspecified: Secondary | ICD-10-CM

## 2023-05-21 DIAGNOSIS — M810 Age-related osteoporosis without current pathological fracture: Secondary | ICD-10-CM | POA: Diagnosis not present

## 2023-05-21 DIAGNOSIS — N1831 Chronic kidney disease, stage 3a: Secondary | ICD-10-CM | POA: Diagnosis not present

## 2023-05-21 DIAGNOSIS — G4700 Insomnia, unspecified: Secondary | ICD-10-CM | POA: Diagnosis not present

## 2023-05-21 DIAGNOSIS — R109 Unspecified abdominal pain: Secondary | ICD-10-CM | POA: Diagnosis not present

## 2023-05-21 DIAGNOSIS — I10 Essential (primary) hypertension: Secondary | ICD-10-CM | POA: Diagnosis not present

## 2023-05-21 DIAGNOSIS — E785 Hyperlipidemia, unspecified: Secondary | ICD-10-CM | POA: Diagnosis not present

## 2023-06-06 DIAGNOSIS — K5900 Constipation, unspecified: Secondary | ICD-10-CM | POA: Diagnosis not present

## 2023-06-06 DIAGNOSIS — R109 Unspecified abdominal pain: Secondary | ICD-10-CM | POA: Diagnosis not present

## 2023-07-19 ENCOUNTER — Encounter: Payer: Self-pay | Admitting: Cardiology

## 2023-07-19 ENCOUNTER — Ambulatory Visit: Payer: Medicare Other | Attending: Cardiology | Admitting: Cardiology

## 2023-07-19 VITALS — BP 138/58 | HR 62 | Ht 67.0 in | Wt 134.6 lb

## 2023-07-19 DIAGNOSIS — I370 Nonrheumatic pulmonary valve stenosis: Secondary | ICD-10-CM | POA: Diagnosis not present

## 2023-07-19 DIAGNOSIS — I10 Essential (primary) hypertension: Secondary | ICD-10-CM | POA: Diagnosis not present

## 2023-07-19 NOTE — Addendum Note (Signed)
Addended by: Luellen Pucker on: 07/19/2023 09:23 AM   Modules accepted: Orders

## 2023-07-19 NOTE — Patient Instructions (Signed)
Medication Instructions:  Your physician recommends that you continue on your current medications as directed. Please refer to the Current Medication list given to you today.  *If you need a refill on your cardiac medications before your next appointment, please call your pharmacy*   Lab Work: None.  If you have labs (blood work) drawn today and your tests are completely normal, you will receive your results only by: MyChart Message (if you have MyChart) OR A paper copy in the mail If you have any lab test that is abnormal or we need to change your treatment, we will call you to review the results.   Testing/Procedures: Your physician has requested that you have an echocardiogram. Echocardiography is a painless test that uses sound waves to create images of your heart. It provides your doctor with information about the size and shape of your heart and how well your heart's chambers and valves are working. This procedure takes approximately one hour. There are no restrictions for this procedure. Please do NOT wear cologne, perfume, aftershave, or lotions (deodorant is allowed). Please arrive 15 minutes prior to your appointment time.    Follow-Up: At [facility], you and your health needs are our priority.  As part of our continuing mission to provide you with exceptional heart care, we have created designated Provider Care Teams.  These Care Teams include your primary Cardiologist (physician) and Advanced Practice Providers (APPs -  Physician Assistants and Nurse Practitioners) who all work together to provide you with the care you need, when you need it.  We recommend signing up for the patient portal called "MyChart".  Sign up information is provided on this After Visit Summary.  MyChart is used to connect with patients for Virtual Visits (Telemedicine).  Patients are able to view lab/test results, encounter notes, upcoming appointments, etc.  Non-urgent messages can be sent to  your provider as well.   To learn more about what you can do with MyChart, go to [email or web address].    Your next appointment:   1 year(s)  Provider:   Traci Turner, MD     

## 2023-07-19 NOTE — Progress Notes (Signed)
Cardiology Office Note:    Date:  07/19/2023   ID:  Carrie Spencer, DOB 06-19-1934, MRN [id number]  PCP:  Carrie Dimitri, MD  Cardiologist:  Carrie Magic, MD    Referring MD: Carrie Dimitri, MD   Chief Complaint  Patient presents with   Follow-up    Pulmonic stenosis    History of Present Illness:    Carrie Spencer is a 89 y.o. female with a hx of rheumatic fever, diastolic dysfunction and pulmonic stenosis.  SHe is here today for followup and is doing well.  She denies any chest pain or pressure, SOB, DOE, PND, orthopnea, LE edema, dizziness, palpitations or syncope. She is compliant with her meds and is tolerating meds with no SE.  She walks 5 -10 miles daily with no problems.    Past Medical History:  Diagnosis Date   Aortic insufficiency    Mild by echo 8/23   Diastolic dysfunction    DJD (degenerative joint disease) of cervical spine    Osteopenia    Pulmonic regurgitation    Mild to moderate by echo 2/23   Pulmonic stenosis 01/15/2015   severe by echo 8/23   Rheumatic fever 01/15/2015   Vitamin D deficiency     Past Surgical History:  Procedure Laterality Date   BREAST BIOPSY     BREAST EXCISIONAL BIOPSY Bilateral    LAPAROSCOPIC CHOLECYSTECTOMY  2009   PARTIAL HYSTERECTOMY      Current Medications: Current Meds  Medication Sig   acetaminophen (TYLENOL) 325 MG tablet Take 650 mg by mouth every 6 (six) hours as needed.   alendronate (FOSAMAX) 70 MG tablet Take 70 mg by mouth once a week.   Cholecalciferol (VITAMIN D3 PO) Take 1 tablet by mouth daily.   SYSTANE ULTRA 0.4-0.3 % SOLN SMARTSIG:1 Drop(s) In Eye(s) As Needed     Allergies:   Codeine and Penicillins   Social History   Socioeconomic History   Marital status: Married    Spouse name: Not on file   Number of children: Not on file   Years of education: Not on file   Highest education level: Not on file  Occupational History   Not on file  Tobacco Use   Smoking status: Never   Smokeless  tobacco: Never  Vaping Use   Vaping status: Never Used  Substance and Sexual Activity   Alcohol use: No   Drug use: No   Sexual activity: Not on file  Other Topics Concern   Not on file  Social History Narrative   Not on file   Social Determinants of Health   Financial Resource Strain: Not on file  Food Insecurity: Not on file  Transportation Needs: Not on file  Physical Activity: Not on file  Stress: Not on file  Social Connections: Not on file     Family History: The patient's family history includes COPD in her mother; CVA in her father; Emphysema in her mother; Heart failure in her father; Pneumonia in her sister. There is no history of Breast cancer.  ROS:   Please see the history of present illness.    ROS  All other systems reviewed and negative.   EKGs/Labs/Other Studies Reviewed:    The following studies were reviewed today: none  EKG Interpretation Date/Time:  Thursday July 19 2023 08:45:24 EDT Ventricular Rate:  62 PR Interval:  212 QRS Duration:  86 QT Interval:  450 QTC Calculation: 456 R Axis:   67  Text Interpretation: Sinus rhythm with  1st degree A-V block When compared with ECG of 29-Dec-2014 16:45, PREVIOUS ECG IS PRESENT Confirmed by Carrie Spencer ([id number]) on 07/19/2023 9:15:17 AM    Recent Labs: 09/11/2022: Hemoglobin 10.9; Platelets 194   Recent Lipid Panel No results found for: "CHOL", "TRIG", "HDL", "CHOLHDL", "VLDL", "LDLCALC", "LDLDIRECT"  Physical Exam:    VS:  BP (!) 138/58   Pulse 62   Ht 5\' 7"  (1.702 m)   Wt 134 lb 9.6 oz (61.1 kg)   SpO2 99%   BMI 21.08 kg/m     Wt Readings from Last 3 Encounters:  07/19/23 134 lb 9.6 oz (61.1 kg)  01/03/22 146 lb 6.4 oz (66.4 kg)  12/03/20 150 lb (68 kg)    GEN: Well nourished, well developed in no acute distress HEENT: Normal NECK: No JVD; No carotid bruits LYMPHATICS: No lymphadenopathy CARDIAC:RRR, no  rubs, gallops.  2/6 SM at the LUSB to LLSB RESPIRATORY:  Clear to  auscultation without rales, wheezing or rhonchi  ABDOMEN: Soft, non-tender, non-distended MUSCULOSKELETAL:  No edema; No deformity  SKIN: Warm and dry NEUROLOGIC:  Alert and oriented x 3 PSYCHIATRIC:  Normal affect  ASSESSMENT:    1. Pulmonary valve stenosis, unspecified etiology    PLAN:    In order of problems listed above:  1.  Rheumatic pulmonic stenosis -2D echo 07/21/2022 showed EF 60 to 65% with mild LVH and grade 1 diastolic dysfunction.  She had severe pulmonic stenosis with a peak velocity of 4 m/s and a mean gradient 35 mmHg.  Findings similar to 01/19/2022.  She was asymptomatic at that time of last office visit -Will repeat 2D echo to make sure that she has not developed right sided enlargement  2.  HTN -BP is well controlled on exam today -She has not required any antihypertensive therapy   Medication Adjustments/Labs and Tests Ordered: Current medicines are reviewed at length with the patient today.  Concerns regarding medicines are outlined above.  Orders Placed This Encounter  Procedures   EKG 12-Lead   No orders of the defined types were placed in this encounter.   Signed, Carrie Magic, MD  07/19/2023 9:15 AM    Carrie Spencer

## 2023-08-08 ENCOUNTER — Other Ambulatory Visit (HOSPITAL_COMMUNITY): Payer: Medicare Other

## 2023-08-09 ENCOUNTER — Ambulatory Visit (HOSPITAL_COMMUNITY): Payer: Medicare Other | Attending: Cardiology

## 2023-08-09 DIAGNOSIS — I370 Nonrheumatic pulmonary valve stenosis: Secondary | ICD-10-CM

## 2023-08-12 LAB — ECHOCARDIOGRAM COMPLETE
Area-P 1/2: 2.97 cm2
MV VTI: 1.48 cm2
P 1/2 time: 606 ms
S' Lateral: 2.1 cm

## 2023-08-14 ENCOUNTER — Encounter: Payer: Self-pay | Admitting: Cardiology

## 2023-08-17 ENCOUNTER — Telehealth: Payer: Self-pay

## 2023-08-17 NOTE — Telephone Encounter (Signed)
-----   Message from Nurse Corky Crafts sent at 08/17/2023  1:30 PM EDT -----  ----- Message ----- From: Henrietta Dine, RN Sent: 08/16/2023   1:20 PM EDT To: [address] Triage

## 2023-08-17 NOTE — Telephone Encounter (Signed)
Call to patient to discuss Echo results. Discussed that echo showed normal heart function with increased stiffness of heart muscle related to aging as well as mildly enlarged atria.  Per Dr. Mayford Knife, there is severe pulmonic stenosis. Patient agrees to [facility] referral in [place] for an evaluation of the pulmonary valve by the heart valve specialists there. All referral info sent via fax.

## 2023-08-17 NOTE — Telephone Encounter (Signed)
Patient is requesting to have a copy of her echo results mailed to the home address on file.

## 2023-08-23 NOTE — Telephone Encounter (Signed)
Call to patient to advise that all materials have been faxed to [facility] and copy of echo has been sent to her home address.

## 2023-08-29 NOTE — Telephone Encounter (Signed)
Pt called back again about referral, she asked that it be faxed over again. I called [facility] to confirm fax number.  Fax: [phone]

## 2023-08-29 NOTE — Telephone Encounter (Signed)
Patient is calling to get information for [facility] so she can schedule since she hasn't heard from them.   She is also requesting notes and results printed out so she can read over them. Requesting to pick these up at office.

## 2023-08-29 NOTE — Telephone Encounter (Signed)
Call to patient to explain that I personally contacted Carrie Spencer at the [facility] via email to verify that all necessary referral materials were received and they will proceed with scheduling patient. No answer, left detailed message per DPR advising that [facility] should be calling her soon to offer appointment. Also advised that if she wants paper copies of her records available at the front desk, she would need to come by and fill out release form. Advised she may be charged records fee and she would have to come back to pick up the prepared records. Also advised that she could print he reports and notes from My[facility] at no charge.

## 2023-08-30 DIAGNOSIS — H35361 Drusen (degenerative) of macula, right eye: Secondary | ICD-10-CM | POA: Diagnosis not present

## 2023-08-30 DIAGNOSIS — H43393 Other vitreous opacities, bilateral: Secondary | ICD-10-CM | POA: Diagnosis not present

## 2023-08-30 DIAGNOSIS — H353132 Nonexudative age-related macular degeneration, bilateral, intermediate dry stage: Secondary | ICD-10-CM | POA: Diagnosis not present

## 2023-08-30 DIAGNOSIS — H40013 Open angle with borderline findings, low risk, bilateral: Secondary | ICD-10-CM | POA: Diagnosis not present

## 2023-10-03 DIAGNOSIS — L905 Scar conditions and fibrosis of skin: Secondary | ICD-10-CM | POA: Diagnosis not present

## 2023-10-03 DIAGNOSIS — Z85828 Personal history of other malignant neoplasm of skin: Secondary | ICD-10-CM | POA: Diagnosis not present

## 2023-10-03 DIAGNOSIS — L821 Other seborrheic keratosis: Secondary | ICD-10-CM | POA: Diagnosis not present

## 2023-10-03 DIAGNOSIS — L814 Other melanin hyperpigmentation: Secondary | ICD-10-CM | POA: Diagnosis not present

## 2023-10-03 DIAGNOSIS — D485 Neoplasm of uncertain behavior of skin: Secondary | ICD-10-CM | POA: Diagnosis not present

## 2023-10-03 DIAGNOSIS — L820 Inflamed seborrheic keratosis: Secondary | ICD-10-CM | POA: Diagnosis not present

## 2023-10-03 DIAGNOSIS — L570 Actinic keratosis: Secondary | ICD-10-CM | POA: Diagnosis not present

## 2023-10-03 DIAGNOSIS — D2239 Melanocytic nevi of other parts of face: Secondary | ICD-10-CM | POA: Diagnosis not present

## 2023-10-03 DIAGNOSIS — C44519 Basal cell carcinoma of skin of other part of trunk: Secondary | ICD-10-CM | POA: Diagnosis not present

## 2023-10-24 ENCOUNTER — Telehealth: Payer: Self-pay | Admitting: Cardiology

## 2023-10-24 DIAGNOSIS — I370 Nonrheumatic pulmonary valve stenosis: Secondary | ICD-10-CM | POA: Diagnosis not present

## 2023-10-24 DIAGNOSIS — Q249 Congenital malformation of heart, unspecified: Secondary | ICD-10-CM | POA: Diagnosis not present

## 2023-10-24 NOTE — Telephone Encounter (Signed)
Dr. Arvilla Market is calling requesting a callback on his cell from Dr. Mayford Knife regarding this pt he states she referred to him.  Please advise.

## 2023-10-26 DIAGNOSIS — I351 Nonrheumatic aortic (valve) insufficiency: Secondary | ICD-10-CM | POA: Diagnosis not present

## 2023-10-29 DIAGNOSIS — C44519 Basal cell carcinoma of skin of other part of trunk: Secondary | ICD-10-CM | POA: Diagnosis not present

## 2023-10-30 NOTE — Telephone Encounter (Signed)
Call to Dr. David Stall to let him know Dr. Mayford Knife is out of the country until 11/08/23. He states it will be fine for Dr. Mayford Knife to call him at [phone]  when she returns.   Dr. Hermelinda Medicus states that on review of records, he feels her pulmonic valve on echo is moderately impaired. He states because patient is 75 and in great shape he's not sure there's a need to dilate her pulmonic valve at this time. He states her right ventricle is not thick but he is going to discuss her case at conference and will be glad to review outcome of that discussion with Dr. Mayford Knife when she returns 11/08/23.

## 2023-11-14 ENCOUNTER — Ambulatory Visit: Payer: Medicare Other | Admitting: Podiatry

## 2023-11-14 ENCOUNTER — Encounter: Payer: Self-pay | Admitting: Podiatry

## 2023-11-14 ENCOUNTER — Ambulatory Visit (INDEPENDENT_AMBULATORY_CARE_PROVIDER_SITE_OTHER): Payer: Medicare Other

## 2023-11-14 VITALS — Ht 67.0 in | Wt 134.0 lb

## 2023-11-14 DIAGNOSIS — L84 Corns and callosities: Secondary | ICD-10-CM

## 2023-11-14 DIAGNOSIS — M21612 Bunion of left foot: Secondary | ICD-10-CM

## 2023-11-14 DIAGNOSIS — M7752 Other enthesopathy of left foot: Secondary | ICD-10-CM | POA: Diagnosis not present

## 2023-11-14 DIAGNOSIS — M21611 Bunion of right foot: Secondary | ICD-10-CM

## 2023-11-14 MED ORDER — TRIAMCINOLONE ACETONIDE 10 MG/ML IJ SUSP
10.0000 mg | Freq: Once | INTRAMUSCULAR | Status: AC
  Administered 2023-11-14: 10 mg via INTRA_ARTICULAR

## 2023-11-14 NOTE — Progress Notes (Signed)
Subjective:   Patient ID: Carrie Spencer, female   DOB: 89 y.o.   MRN: [id number]   HPI Patient presents with painful digital deformity with fluid buildup fourth toe left that is becoming worse and then also complains of severe lesions on both feet that are painful   ROS      Objective:  Physical Exam  Neurovascular status intact significant structural bunion deformity both feet that she is concerned about with lesion interspace fourth left painful with pressed fluid buildup around the inner phalangeal joint and keratotic lesion subfirst metatarsal bilateral hallux bilateral     Assessment:  Severe structural deformity along with lesion formation hammertoe deformity fourth left inner phalangeal capsule inflammation     Plan:  H&P all conditions reviewed and x-rays.  At this point do not recommend surgery even though at 1 point it is possible that will be necessary.  I went ahead did sterile prep injected the interphalange joint digit for left 2 mg Dexasone Kenalog 5 mg Xylocaine I then debrided all lesions on both feet no iatrogenic bleeding courtesy debridement nails reappoint to recheck as needed  X-rays indicate significant structural bunion deformity bilateral would be very difficult to correct with hammertoe deformity

## 2023-11-16 DIAGNOSIS — Z0000 Encounter for general adult medical examination without abnormal findings: Secondary | ICD-10-CM | POA: Diagnosis not present

## 2023-11-16 DIAGNOSIS — I10 Essential (primary) hypertension: Secondary | ICD-10-CM | POA: Diagnosis not present

## 2023-11-16 DIAGNOSIS — N1831 Chronic kidney disease, stage 3a: Secondary | ICD-10-CM | POA: Diagnosis not present

## 2023-11-16 DIAGNOSIS — M810 Age-related osteoporosis without current pathological fracture: Secondary | ICD-10-CM | POA: Diagnosis not present

## 2023-11-16 DIAGNOSIS — E785 Hyperlipidemia, unspecified: Secondary | ICD-10-CM | POA: Diagnosis not present

## 2023-11-16 DIAGNOSIS — I370 Nonrheumatic pulmonary valve stenosis: Secondary | ICD-10-CM | POA: Diagnosis not present

## 2023-11-16 DIAGNOSIS — D509 Iron deficiency anemia, unspecified: Secondary | ICD-10-CM | POA: Diagnosis not present

## 2023-11-16 DIAGNOSIS — G4700 Insomnia, unspecified: Secondary | ICD-10-CM | POA: Diagnosis not present

## 2023-11-20 DIAGNOSIS — D509 Iron deficiency anemia, unspecified: Secondary | ICD-10-CM | POA: Diagnosis not present

## 2023-11-20 DIAGNOSIS — M810 Age-related osteoporosis without current pathological fracture: Secondary | ICD-10-CM | POA: Diagnosis not present

## 2023-11-20 DIAGNOSIS — I370 Nonrheumatic pulmonary valve stenosis: Secondary | ICD-10-CM | POA: Diagnosis not present

## 2023-11-20 DIAGNOSIS — I10 Essential (primary) hypertension: Secondary | ICD-10-CM | POA: Diagnosis not present

## 2023-11-20 DIAGNOSIS — E785 Hyperlipidemia, unspecified: Secondary | ICD-10-CM | POA: Diagnosis not present

## 2023-11-20 DIAGNOSIS — N1831 Chronic kidney disease, stage 3a: Secondary | ICD-10-CM | POA: Diagnosis not present

## 2024-04-10 ENCOUNTER — Ambulatory Visit: Admitting: Podiatry

## 2024-04-10 ENCOUNTER — Encounter: Payer: Self-pay | Admitting: Podiatry

## 2024-04-10 DIAGNOSIS — D239 Other benign neoplasm of skin, unspecified: Secondary | ICD-10-CM

## 2024-04-10 DIAGNOSIS — D2371 Other benign neoplasm of skin of right lower limb, including hip: Secondary | ICD-10-CM | POA: Diagnosis not present

## 2024-04-10 NOTE — Progress Notes (Signed)
 She presents today after having seen Dr. Celia Coles and Dr. Luster Salters in the past with a chief concern of painful corns and calluses plantar aspect of bilateral foot.  Objective: Vital signs are stable alert oriented x 3 severe hallux valgus deformity with hammertoe deformities resulting in plantarflexed second metatarsals and benign skin lesions plantarly.  There is no erythema edema cellulitis drainage or odor no signs of infection around these callosities.  Assessment: Callosities benign skin lesions bilateral foot plantarly multiple in nature hammertoe deformity hallux valgus deformities.  Plan: Discussed etiology pathology conservative surgical therapies at this point I debrided all callosities placed Salinocaine under occlusion to be left on for 3 days before be washed off.  Did briefly touch on disarticulation of the second toes which would help alleviate her symptomatology.  She is not interested in this at this time.

## 2024-04-14 ENCOUNTER — Other Ambulatory Visit: Payer: Self-pay | Admitting: Family Medicine

## 2024-04-14 DIAGNOSIS — Z1231 Encounter for screening mammogram for malignant neoplasm of breast: Secondary | ICD-10-CM

## 2024-04-20 ENCOUNTER — Ambulatory Visit (HOSPITAL_COMMUNITY): Admission: EM | Admit: 2024-04-20 | Discharge: 2024-04-20 | Disposition: A | Discharge status: Home / Self Care

## 2024-04-20 ENCOUNTER — Encounter (HOSPITAL_COMMUNITY): Payer: Self-pay

## 2024-04-20 ENCOUNTER — Ambulatory Visit (INDEPENDENT_AMBULATORY_CARE_PROVIDER_SITE_OTHER)

## 2024-04-20 DIAGNOSIS — S92352A Displaced fracture of fifth metatarsal bone, left foot, initial encounter for closed fracture: Secondary | ICD-10-CM

## 2024-04-20 DIAGNOSIS — M2012 Hallux valgus (acquired), left foot: Secondary | ICD-10-CM | POA: Diagnosis not present

## 2024-04-20 NOTE — ED Provider Notes (Signed)
 [facility]  Note:  This document was prepared using Dragon voice recognition software and may include unintentional dictation errors.  MRN: [id number] DOB: 1934/08/07  Subjective:   Carrie Spencer is a 89 y.o. female presenting for left foot pain since yesterday.  Patient reports that she twisted her foot while walking causing immediate swelling and pain to the lateral aspect of the left foot.  Patient has moderate bruising and swelling noted over left fifth metatarsal.  Patient reports increased pain with ambulation but no pain at rest.  Patient denies any previous trauma or injury to the left foot.  Patient has not taken any over-the-counter medication to treat symptoms.  No current facility-administered medications for this encounter.  Current Outpatient Medications:    acetaminophen (TYLENOL) 325 MG tablet, Take 650 mg by mouth every 6 (six) hours as needed., Disp: , Rfl:    alendronate (FOSAMAX) 70 MG tablet, Take 70 mg by mouth once a week., Disp: , Rfl:    Cholecalciferol (VITAMIN D3 PO), Take 1 tablet by mouth daily., Disp: , Rfl:    SYSTANE ULTRA 0.4-0.3 % SOLN, SMARTSIG:1 Drop(s) In Eye(s) As Needed, Disp: , Rfl:    Allergies  Allergen Reactions   Codeine Nausea Only   Penicillins Rash    Past Medical History:  Diagnosis Date   Aortic insufficiency    Mild by echo 8/23   Diastolic dysfunction    DJD (degenerative joint disease) of cervical spine    Osteopenia    Pulmonic regurgitation    Mild to moderate by echo 2/23   Pulmonic stenosis 01/15/2015   severe by echo 8/23 abd 07/2023 with RVH   Rheumatic fever 01/15/2015   Vitamin D deficiency      Past Surgical History:  Procedure Laterality Date   BREAST BIOPSY     BREAST EXCISIONAL BIOPSY Bilateral    LAPAROSCOPIC CHOLECYSTECTOMY  2009   PARTIAL HYSTERECTOMY      Family History  Problem Relation Age of Onset   Emphysema Mother    COPD Mother    Heart failure Father    CVA Father     Pneumonia Sister    Breast cancer Neg Hx     Social History   Tobacco Use   Smoking status: Never   Smokeless tobacco: Never  Vaping Use   Vaping status: Never Used  Substance Use Topics   Alcohol use: No   Drug use: No    ROS Refer to HPI for ROS details.  Objective:   Vitals: BP (!) 135/54 (BP Location: Left Arm)   Pulse 67   Temp 98.2 F (36.8 C) (Oral)   Resp 16   SpO2 97%   Physical Exam Vitals and nursing note reviewed.  Constitutional:      General: She is not in acute distress.    Appearance: She is well-developed. She is not ill-appearing or toxic-appearing.  HENT:     Head: Normocephalic and atraumatic.  Cardiovascular:     Rate and Rhythm: Normal rate.  Pulmonary:     Effort: Pulmonary effort is normal. No respiratory distress.  Musculoskeletal:     Left foot: Decreased range of motion. Normal capillary refill. Swelling, deformity, tenderness and bony tenderness present. Normal pulse.  Skin:    General: Skin is warm and dry.  Neurological:     General: No focal deficit present.     Mental Status: She is alert and oriented to person, place, and time.  Psychiatric:  Mood and Affect: Mood normal.        Behavior: Behavior normal.     Procedures  No results found for this or any previous visit (from the past 24 hours).  DG Foot Complete Left Result Date: 04/20/2024 CLINICAL DATA:  Left foot pain 1 day after twisting injury. EXAM: LEFT FOOT - COMPLETE 3+ VIEW COMPARISON:  11/14/2023 FINDINGS: Moderate hallux valgus deformity unchanged. Mild diffuse decreased bone mineralization. Displaced oblique fracture of the mid to distal aspect of the fifth metatarsal. Remainder of the exam is unremarkable. IMPRESSION: 1. Displaced oblique fracture of the mid to distal aspect of the fifth metatarsal. 2. Moderate hallux valgus deformity. Electronically Signed   By: Roda Cirri M.D.   On: 04/20/2024 15:04     Assessment and Plan :     Discharge  Instructions       1. Closed displaced fracture of fifth metatarsal bone of left foot, initial encounter (Primary) - DG Foot Complete Left x-ray shows displaced left fifth metatarsal fracture, no other fracture or dislocation noted - Apply splint short leg in UC for protection and immobilization until follow-up with orthopedic surgery - AMB referral to orthopedics for further evaluation ongoing management of left fifth metatarsal fracture.    Kynsley Whitehouse B Olanda Downie   Archer Vise, [place], [place] 04/20/24 1511

## 2024-04-20 NOTE — Progress Notes (Signed)
 Orthopedic Tech Progress Note Patient Details:  Carrie Spencer 1934-09-20 [id number]  Order was changed from a posterior short leg splint to a CAM boot by Emmanuel Hark, NP. I applied a gentle compressive dressing of webril and ace prior to the CAM boot being provided by UC staff.  Patient ID: Carrie Spencer, female   DOB: [birth year], 89 y.o.   MRN: [id number]  Carrie Spencer 04/20/2024, 4:08 PM

## 2024-04-20 NOTE — ED Triage Notes (Signed)
 Patient here today with c/o left foot pain X 1 day after rolling twisting her foot. Patient has some bruising and swelling.

## 2024-04-20 NOTE — ED Notes (Signed)
 Ortho tech called for posterior short leg splint. Informed they would be in route in a few minutes.

## 2024-04-20 NOTE — Discharge Instructions (Addendum)
  1. Closed displaced fracture of fifth metatarsal bone of left foot, initial encounter (Primary) - DG Foot Complete Left x-ray shows displaced left fifth metatarsal fracture, no other fracture or dislocation noted - Apply splint short leg in UC for protection and immobilization until follow-up with orthopedic surgery - AMB referral to orthopedics for further evaluation ongoing management of left fifth metatarsal fracture.

## 2024-04-22 ENCOUNTER — Ambulatory Visit: Admitting: Family Medicine

## 2024-04-22 ENCOUNTER — Ambulatory Visit: Admitting: Orthopedic Surgery

## 2024-04-22 ENCOUNTER — Encounter: Payer: Self-pay | Admitting: Orthopedic Surgery

## 2024-04-22 DIAGNOSIS — M8000XA Age-related osteoporosis with current pathological fracture, unspecified site, initial encounter for fracture: Secondary | ICD-10-CM | POA: Diagnosis not present

## 2024-04-22 DIAGNOSIS — M84375A Stress fracture, left foot, initial encounter for fracture: Secondary | ICD-10-CM

## 2024-04-22 NOTE — Progress Notes (Signed)
 Office Visit Note   Patient: Carrie Spencer           Date of Birth: 08/22/1934           MRN: [id number] Visit Date: 04/22/2024              Requested by: Helyn Lobstein, MD [address] [place],  [place] [address] PCP: Helyn Lobstein, MD  Chief Complaint  Patient presents with   Left Foot - Fracture      HPI: Patient is a 89 year old woman who is seen for initial evaluation for oblique fracture fifth metatarsal left foot.  Patient states she merely twisted her foot while walking and had acute onset of pain along the fifth metatarsal.  Patient is currently in a cam walker.  Assessment & Plan: Visit Diagnoses:  1. Fracture, stress, metatarsal, left, initial encounter   2. Age-related osteoporosis with current pathological fracture, initial encounter     Plan: Will place her in a postop Velcro shoe with follow-up in 4 weeks with repeat three-view radiographs of the left foot.  Follow-Up Instructions: Return in about 4 weeks (around 05/20/2024).   Ortho Exam  Patient is alert, oriented, no adenopathy, well-dressed, normal affect, normal respiratory effort. Examination patient is a good dorsalis pedis pulse.  There is no skin breakdown there is bruising laterally there is tenderness to palpation over the fifth metatarsal but no angular deformity.  Review of the radiographs shows a long oblique fracture of the fifth metatarsal distally.  Patient is currently on Fosamax.  I have reviewed the patient's history and given the presence of a fragility fracture, I have deemed the necessity of a osteoporosis management referral or confirmed that the patient is currently enrolled in a osteoporosis treatment program.   Imaging: No results found. No images are attached to the encounter.  Labs: No results found for: "HGBA1C", "ESRSEDRATE", "CRP", "LABURIC", "REPTSTATUS", "GRAMSTAIN", "CULT", "LABORGA"   Lab Results  Component Value Date   ALBUMIN 4.1 12/29/2014   ALBUMIN 3.4  (L) 09/15/2008    No results found for: "MG" No results found for: "VD25OH"  No results found for: "PREALBUMIN"    Latest Ref Rng & Units 09/11/2022    2:06 PM 12/29/2014    4:19 PM 09/15/2008    2:00 PM  CBC EXTENDED  WBC 3.4 - 10.8 x10E3/uL 4.3  7.3  4.5   RBC 3.77 - 5.28 x10E6/uL 3.63  4.83  3.91   Hemoglobin 11.1 - 15.9 g/dL 19.1  47.8  29.5   HCT 34.0 - 46.6 % 33.2  43.2  35.1   Platelets 150 - 450 x10E3/uL 194  234  209   NEUT# 1.7 - 7.7 K/uL  6.4  3.1   Lymph# 0.7 - 4.0 K/uL  0.6  0.9      There is no height or weight on file to calculate BMI.  Orders:  No orders of the defined types were placed in this encounter.  No orders of the defined types were placed in this encounter.    Procedures: No procedures performed  Clinical Data: No additional findings.  ROS:  All other systems negative, except as noted in the HPI. Review of Systems  Objective: Vital Signs: There were no vitals taken for this visit.  Specialty Comments:  No specialty comments available.  PMFS History: Patient Active Problem List   Diagnosis Date Noted   Pulmonic regurgitation 01/19/2022   Aortic insufficiency 01/19/2022   Heart palpitations 09/13/2016   Benign essential  HTN 01/15/2015   Pulmonic stenosis 01/15/2015   Rheumatic fever 01/15/2015   Past Medical History:  Diagnosis Date   Aortic insufficiency    Mild by echo 8/23   Diastolic dysfunction    DJD (degenerative joint disease) of cervical spine    Osteopenia    Pulmonic regurgitation    Mild to moderate by echo 2/23   Pulmonic stenosis 01/15/2015   severe by echo 8/23 abd 07/2023 with RVH   Rheumatic fever 01/15/2015   Vitamin D deficiency     Family History  Problem Relation Age of Onset   Emphysema Mother    COPD Mother    Heart failure Father    CVA Father    Pneumonia Sister    Breast cancer Neg Hx     Past Surgical History:  Procedure Laterality Date   BREAST BIOPSY     BREAST EXCISIONAL BIOPSY  Bilateral    LAPAROSCOPIC CHOLECYSTECTOMY  2009   PARTIAL HYSTERECTOMY     Social History   Occupational History   Not on file  Tobacco Use   Smoking status: Never   Smokeless tobacco: Never  Vaping Use   Vaping status: Never Used  Substance and Sexual Activity   Alcohol use: No   Drug use: No   Sexual activity: Not on file

## 2024-04-30 ENCOUNTER — Telehealth: Payer: Self-pay

## 2024-04-30 NOTE — Telephone Encounter (Signed)
 Patient called stating that she is having sharp pain on the the opposite side of her left foot.  Would like to know if she should be concerned, how much pressure should she apply, and what she is able to do and not do?  CB# [phone].  Please advise.

## 2024-05-01 ENCOUNTER — Telehealth: Payer: Self-pay | Admitting: Orthopedic Surgery

## 2024-05-01 NOTE — Telephone Encounter (Signed)
 SW pt, see other TE in reference to this.

## 2024-05-01 NOTE — Telephone Encounter (Signed)
 SW pt, she says that she is having pain on medial part of her left foot. She is in a post op shoe and I suggested that she could be putting more weight on that side of her foot with walking since her fx is on the lateral portion of her foot. I let her know that if no better on Monday to call office and we can see if we could put her on another provider's schedule. She could see Norma Beckers if she has availability.

## 2024-05-01 NOTE — Telephone Encounter (Signed)
 Pt calling back to check the status of the message she left yesterday regarding the severe foot pain she's having

## 2024-05-08 DIAGNOSIS — I370 Nonrheumatic pulmonary valve stenosis: Secondary | ICD-10-CM | POA: Diagnosis not present

## 2024-05-09 DIAGNOSIS — I351 Nonrheumatic aortic (valve) insufficiency: Secondary | ICD-10-CM | POA: Diagnosis not present

## 2024-05-15 ENCOUNTER — Ambulatory Visit
Admission: RE | Admit: 2024-05-15 | Discharge: 2024-05-15 | Disposition: A | Discharge status: Home / Self Care | Source: Ambulatory Visit | Attending: Family Medicine | Admitting: Family Medicine

## 2024-05-15 DIAGNOSIS — Z1231 Encounter for screening mammogram for malignant neoplasm of breast: Secondary | ICD-10-CM | POA: Diagnosis not present

## 2024-05-20 ENCOUNTER — Other Ambulatory Visit (INDEPENDENT_AMBULATORY_CARE_PROVIDER_SITE_OTHER): Payer: Self-pay

## 2024-05-20 ENCOUNTER — Ambulatory Visit: Admitting: Orthopedic Surgery

## 2024-05-20 DIAGNOSIS — M84375A Stress fracture, left foot, initial encounter for fracture: Secondary | ICD-10-CM

## 2024-05-26 ENCOUNTER — Encounter: Payer: Self-pay | Admitting: Orthopedic Surgery

## 2024-05-26 NOTE — Progress Notes (Signed)
 Office Visit Note   Patient: Carrie Spencer           Date of Birth: 05-Aug-1934           MRN: [id number] Visit Date: 05/20/2024              Requested by: Aisha Harvey, MD [address] [place],  [place] [address] PCP: Aisha Harvey, MD  Chief Complaint  Patient presents with   Left Foot - Follow-up    5th MT fx       HPI: Patient is an 89 year old woman who is seen in follow-up for a spiral stress fracture of the left fifth metatarsal shaft.  Patient is in a postoperative shoe she states she is doing well.  Patient states she does have sharp pain in the plantar aspect of her foot.  Assessment & Plan: Visit Diagnoses:  1. Fracture, stress, metatarsal, left, initial encounter     Plan: Continue with the postoperative shoe.  Repeat three-view radiographs of the left foot at follow-up.  Follow-Up Instructions: Return in about 3 weeks (around 06/10/2024).   Ortho Exam  Patient is alert, oriented, no adenopathy, well-dressed, normal affect, normal respiratory effort. Examination patient's calf is soft nontender no pain with dorsiflexion of the ankle no evidence of a DVT.  Patient has no pain with weightbearing on her foot.  There is tenderness to palpation along the fifth metatarsal.  Patient has delayed union of the fifth metatarsal fracture.    Imaging: No results found. No images are attached to the encounter.  Labs: No results found for: HGBA1C, ESRSEDRATE, CRP, LABURIC, REPTSTATUS, GRAMSTAIN, CULT, LABORGA   Lab Results  Component Value Date   ALBUMIN 4.1 12/29/2014   ALBUMIN 3.4 (L) 09/15/2008    No results found for: MG No results found for: VD25OH  No results found for: PREALBUMIN    Latest Ref Rng & Units 09/11/2022    2:06 PM 12/29/2014    4:19 PM 09/15/2008    2:00 PM  CBC EXTENDED  WBC 3.4 - 10.8 x10E3/uL 4.3  7.3  4.5   RBC 3.77 - 5.28 x10E6/uL 3.63  4.83  3.91   Hemoglobin 11.1 - 15.9 g/dL 89.0  85.6  88.1   HCT  34.0 - 46.6 % 33.2  43.2  35.1   Platelets 150 - 450 x10E3/uL 194  234  209   NEUT# 1.7 - 7.7 K/uL  6.4  3.1   Lymph# 0.7 - 4.0 K/uL  0.6  0.9      There is no height or weight on file to calculate BMI.  Orders:  Orders Placed This Encounter  Procedures   XR Foot Complete Left   No orders of the defined types were placed in this encounter.    Procedures: No procedures performed  Clinical Data: No additional findings.  ROS:  All other systems negative, except as noted in the HPI. Review of Systems  Objective: Vital Signs: There were no vitals taken for this visit.  Specialty Comments:  No specialty comments available.  PMFS History: Patient Active Problem List   Diagnosis Date Noted   Pulmonic regurgitation 01/19/2022   Aortic insufficiency 01/19/2022   Heart palpitations 09/13/2016   Benign essential HTN 01/15/2015   Pulmonic stenosis 01/15/2015   Rheumatic fever 01/15/2015   Past Medical History:  Diagnosis Date   Aortic insufficiency    Mild by echo 8/23   Diastolic dysfunction    DJD (degenerative joint disease) of cervical spine  Osteopenia    Pulmonic regurgitation    Mild to moderate by echo 2/23   Pulmonic stenosis 01/15/2015   severe by echo 8/23 abd 07/2023 with RVH   Rheumatic fever 01/15/2015   Vitamin D deficiency     Family History  Problem Relation Age of Onset   Emphysema Mother    COPD Mother    Heart failure Father    CVA Father    Pneumonia Sister    Breast cancer Neg Hx     Past Surgical History:  Procedure Laterality Date   BREAST BIOPSY     BREAST EXCISIONAL BIOPSY Bilateral    LAPAROSCOPIC CHOLECYSTECTOMY  2009   PARTIAL HYSTERECTOMY     Social History   Occupational History   Not on file  Tobacco Use   Smoking status: Never   Smokeless tobacco: Never  Vaping Use   Vaping status: Never Used  Substance and Sexual Activity   Alcohol use: No   Drug use: No   Sexual activity: Not on file

## 2024-06-03 ENCOUNTER — Ambulatory Visit: Admitting: Orthopedic Surgery

## 2024-06-16 ENCOUNTER — Ambulatory Visit: Admitting: Orthopedic Surgery

## 2024-06-16 ENCOUNTER — Telehealth: Payer: Self-pay | Admitting: Orthopedic Surgery

## 2024-06-16 ENCOUNTER — Other Ambulatory Visit (INDEPENDENT_AMBULATORY_CARE_PROVIDER_SITE_OTHER)

## 2024-06-16 DIAGNOSIS — M84375A Stress fracture, left foot, initial encounter for fracture: Secondary | ICD-10-CM

## 2024-06-16 DIAGNOSIS — M8000XA Age-related osteoporosis with current pathological fracture, unspecified site, initial encounter for fracture: Secondary | ICD-10-CM

## 2024-06-16 NOTE — Telephone Encounter (Signed)
 Patient called. She would like to be worked in today. Says she did not cancel her appointment for today. Her foot is hurting. Her cb# [phone]

## 2024-06-16 NOTE — Telephone Encounter (Signed)
 I called and lm on vm to advise I will sch the appt for the same time 1:15 today. It looks like the appt was cancelled on 06/02/2024 r/s as requested.

## 2024-06-17 ENCOUNTER — Encounter: Payer: Self-pay | Admitting: Orthopedic Surgery

## 2024-06-17 NOTE — Progress Notes (Signed)
 Office Visit Note   Patient: Carrie Spencer           Date of Birth: Jan 13, 1934           MRN: [id number] Visit Date: 06/16/2024              Requested by: Aisha Harvey, MD [address] [place],  [place] [address] PCP: Aisha Harvey, MD  Chief Complaint  Patient presents with   Left Foot - Follow-up      HPI: Patient is a 89 year old woman who presents in follow-up for a stress fracture fifth metatarsal shaft left foot.  Patient is on Fosamax.  Assessment & Plan: Visit Diagnoses:  1. Fracture, stress, metatarsal, left, initial encounter   2. Age-related osteoporosis with current pathological fracture, initial encounter     Plan: Patient will increase her activities as tolerated recommended a stiff soled shoe.  Recommended loadbearing exercises and continue with the bisphosphonate.  Follow-Up Instructions: No follow-ups on file.   Ortho Exam  Patient is alert, oriented, no adenopathy, well-dressed, normal affect, normal respiratory effort. Examination patient has no tenderness to palpation of the posterior tibial tendon.  She does have some pain to palpation along the arch.  The fifth metatarsal fracture is nontender to palpation.    Imaging: No results found. No images are attached to the encounter.  Labs: No results found for: HGBA1C, ESRSEDRATE, CRP, LABURIC, REPTSTATUS, GRAMSTAIN, CULT, LABORGA   Lab Results  Component Value Date   ALBUMIN 4.1 12/29/2014   ALBUMIN 3.4 (L) 09/15/2008    No results found for: MG No results found for: VD25OH  No results found for: PREALBUMIN    Latest Ref Rng & Units 09/11/2022    2:06 PM 12/29/2014    4:19 PM 09/15/2008    2:00 PM  CBC EXTENDED  WBC 3.4 - 10.8 x10E3/uL 4.3  7.3  4.5   RBC 3.77 - 5.28 x10E6/uL 3.63  4.83  3.91   Hemoglobin 11.1 - 15.9 g/dL 89.0  85.6  88.1   HCT 34.0 - 46.6 % 33.2  43.2  35.1   Platelets 150 - 450 x10E3/uL 194  234  209   NEUT# 1.7 - 7.7 K/uL  6.4  3.1    Lymph# 0.7 - 4.0 K/uL  0.6  0.9      There is no height or weight on file to calculate BMI.  Orders:  Orders Placed This Encounter  Procedures   XR Foot Complete Left   No orders of the defined types were placed in this encounter.    Procedures: No procedures performed  Clinical Data: No additional findings.  ROS:  All other systems negative, except as noted in the HPI. Review of Systems  Objective: Vital Signs: There were no vitals taken for this visit.  Specialty Comments:  No specialty comments available.  PMFS History: Patient Active Problem List   Diagnosis Date Noted   Pulmonic regurgitation 01/19/2022   Aortic insufficiency 01/19/2022   Heart palpitations 09/13/2016   Benign essential HTN 01/15/2015   Pulmonic stenosis 01/15/2015   Rheumatic fever 01/15/2015   Past Medical History:  Diagnosis Date   Aortic insufficiency    Mild by echo 8/23   Diastolic dysfunction    DJD (degenerative joint disease) of cervical spine    Osteopenia    Pulmonic regurgitation    Mild to moderate by echo 2/23   Pulmonic stenosis 01/15/2015   severe by echo 8/23 abd 07/2023 with RVH   Rheumatic  fever 01/15/2015   Vitamin D deficiency     Family History  Problem Relation Age of Onset   Emphysema Mother    COPD Mother    Heart failure Father    CVA Father    Pneumonia Sister    Breast cancer Neg Hx     Past Surgical History:  Procedure Laterality Date   BREAST BIOPSY     BREAST EXCISIONAL BIOPSY Bilateral    LAPAROSCOPIC CHOLECYSTECTOMY  2009   PARTIAL HYSTERECTOMY     Social History   Occupational History   Not on file  Tobacco Use   Smoking status: Never   Smokeless tobacco: Never  Vaping Use   Vaping status: Never Used  Substance and Sexual Activity   Alcohol use: No   Drug use: No   Sexual activity: Not on file

## 2024-06-26 ENCOUNTER — Telehealth: Payer: Self-pay

## 2024-06-26 NOTE — Telephone Encounter (Signed)
 Can you please call pt? Will need eval in office this is new finding. Carrie Spencer has appt open tomorrow.

## 2024-06-26 NOTE — Telephone Encounter (Signed)
 Patient is having a lot of pain at night and the pain radiates up to her knee at times. She states it only hurts at night. States it hurts worse than it ever did. She is asking to have a return call she has some questions for the nurse.  Please call and advise

## 2024-06-30 ENCOUNTER — Encounter: Payer: Self-pay | Admitting: Orthopedic Surgery

## 2024-06-30 ENCOUNTER — Ambulatory Visit: Admitting: Orthopedic Surgery

## 2024-06-30 ENCOUNTER — Other Ambulatory Visit (INDEPENDENT_AMBULATORY_CARE_PROVIDER_SITE_OTHER)

## 2024-06-30 DIAGNOSIS — M76822 Posterior tibial tendinitis, left leg: Secondary | ICD-10-CM

## 2024-06-30 DIAGNOSIS — M79672 Pain in left foot: Secondary | ICD-10-CM

## 2024-06-30 NOTE — Progress Notes (Signed)
 Office Visit Note   Patient: Carrie Spencer           Date of Birth: 05/22/1934           MRN: [id number] Visit Date: 06/30/2024              Requested by: Aisha Harvey, MD [address] [place],  [place] [address] PCP: Aisha Harvey, MD  Chief Complaint  Patient presents with   Left Foot - Pain      HPI: Patient is an 89 year old woman who presents with pain along the posterior tibial tendon on the left.  Patient states the pain is mainly at night does not have send during the day.  Patient states this has started after her fifth metatarsal stress fracture.  Assessment & Plan: Visit Diagnoses:  1. Pain in left foot   2. Posterior tibial tendon dysfunction (PTTD) of left lower extremity     Plan: Recommended stiff soled athletic sneaker such as new balance.  Recommended orthotics and Voltaren gel topically  Follow-Up Instructions: No follow-ups on file.   Ortho Exam  Patient is alert, oriented, no adenopathy, well-dressed, normal affect, normal respiratory effort. Examination patient has a good pulse she is tender to palpation at the insertion of the posterior tibial tendon she has a good arch no valgus collapse.  She cannot get up on her toes.    Imaging: No results found. No images are attached to the encounter.  Labs: No results found for: HGBA1C, ESRSEDRATE, CRP, LABURIC, REPTSTATUS, GRAMSTAIN, CULT, LABORGA   Lab Results  Component Value Date   ALBUMIN 4.1 12/29/2014   ALBUMIN 3.4 (L) 09/15/2008    No results found for: MG No results found for: VD25OH  No results found for: PREALBUMIN    Latest Ref Rng & Units 09/11/2022    2:06 PM 12/29/2014    4:19 PM 09/15/2008    2:00 PM  CBC EXTENDED  WBC 3.4 - 10.8 x10E3/uL 4.3  7.3  4.5   RBC 3.77 - 5.28 x10E6/uL 3.63  4.83  3.91   Hemoglobin 11.1 - 15.9 g/dL 89.0  85.6  88.1   HCT 34.0 - 46.6 % 33.2  43.2  35.1   Platelets 150 - 450 x10E3/uL 194  234  209   NEUT# 1.7 - 7.7  K/uL  6.4  3.1   Lymph# 0.7 - 4.0 K/uL  0.6  0.9      There is no height or weight on file to calculate BMI.  Orders:  Orders Placed This Encounter  Procedures   XR Foot Complete Left   No orders of the defined types were placed in this encounter.    Procedures: No procedures performed  Clinical Data: No additional findings.  ROS:  All other systems negative, except as noted in the HPI. Review of Systems  Objective: Vital Signs: There were no vitals taken for this visit.  Specialty Comments:  No specialty comments available.  PMFS History: Patient Active Problem List   Diagnosis Date Noted   Pulmonic regurgitation 01/19/2022   Aortic insufficiency 01/19/2022   Heart palpitations 09/13/2016   Benign essential HTN 01/15/2015   Pulmonic stenosis 01/15/2015   Rheumatic fever 01/15/2015   Past Medical History:  Diagnosis Date   Aortic insufficiency    Mild by echo 8/23   Diastolic dysfunction    DJD (degenerative joint disease) of cervical spine    Osteopenia    Pulmonic regurgitation    Mild to moderate by echo  2/23   Pulmonic stenosis 01/15/2015   severe by echo 8/23 abd 07/2023 with RVH   Rheumatic fever 01/15/2015   Vitamin D deficiency     Family History  Problem Relation Age of Onset   Emphysema Mother    COPD Mother    Heart failure Father    CVA Father    Pneumonia Sister    Breast cancer Neg Hx     Past Surgical History:  Procedure Laterality Date   BREAST BIOPSY     BREAST EXCISIONAL BIOPSY Bilateral    LAPAROSCOPIC CHOLECYSTECTOMY  2009   PARTIAL HYSTERECTOMY     Social History   Occupational History   Not on file  Tobacco Use   Smoking status: Never   Smokeless tobacco: Never  Vaping Use   Vaping status: Never Used  Substance and Sexual Activity   Alcohol use: No   Drug use: No   Sexual activity: Not on file

## 2024-07-29 ENCOUNTER — Encounter: Payer: Self-pay | Admitting: Physician Assistant

## 2024-07-29 ENCOUNTER — Ambulatory Visit: Admitting: Physician Assistant

## 2024-07-29 DIAGNOSIS — M84375A Stress fracture, left foot, initial encounter for fracture: Secondary | ICD-10-CM

## 2024-07-29 DIAGNOSIS — M76822 Posterior tibial tendinitis, left leg: Secondary | ICD-10-CM

## 2024-07-29 NOTE — Progress Notes (Signed)
 Office Visit Note   Patient: Carrie Spencer           Date of Birth: 08-02-1934           MRN: [id number] Visit Date: 07/29/2024              Requested by: Aisha Harvey, MD [address] [place],  [place] [address] PCP: Aisha Harvey, MD  Chief Complaint  Patient presents with   Left Foot - Pain      HPI: 89 y/o female with history of fifth metatarsal fracture.  She has developed posterior tibial tendonitis during her recovery.  The lateral foot no longer hurts.  She is working back up to walking daily.  She stops if the pain increases and rest helps.  She denies edema.  She has purchase new stiffer walking shoes, but has not gotten ne orthotics inserts yet.    Assessment & Plan: Visit Diagnoses:  1. Posterior tibial tendon dysfunction (PTTD) of left lower extremity   2. Fracture, stress, metatarsal, left, initial encounter     Plan: Continue to walk for exercise as tolerates.  She will get new orthotic inserts and walk in her new stiff walking shoes.  Progressive walking with rest if the pin comes back.  RICE as needed.  Voltaren as needed.  Follow-Up Instructions: Return in about 3 weeks (around 08/19/2024).   Ortho Exam  Patient is alert, oriented, no adenopathy, well-dressed, normal affect, normal respiratory effort. Palpable pedal pulse, no edema and no cellulitis.  She is non tender over the course of the PT tendon.  Resistance with good strength.  Slight tenderness over the fifth MT to resistance.  Full active motion of the foot and ankle.      Imaging: No results found. No images are attached to the encounter.  Labs: No results found for: HGBA1C, ESRSEDRATE, CRP, LABURIC, REPTSTATUS, GRAMSTAIN, CULT, LABORGA   Lab Results  Component Value Date   ALBUMIN 4.1 12/29/2014   ALBUMIN 3.4 (L) 09/15/2008    No results found for: MG No results found for: VD25OH  No results found for: PREALBUMIN    Latest Ref Rng & Units 09/11/2022     2:06 PM 12/29/2014    4:19 PM 09/15/2008    2:00 PM  CBC EXTENDED  WBC 3.4 - 10.8 x10E3/uL 4.3  7.3  4.5   RBC 3.77 - 5.28 x10E6/uL 3.63  4.83  3.91   Hemoglobin 11.1 - 15.9 g/dL 89.0  85.6  88.1   HCT 34.0 - 46.6 % 33.2  43.2  35.1   Platelets 150 - 450 x10E3/uL 194  234  209   NEUT# 1.7 - 7.7 K/uL  6.4  3.1   Lymph# 0.7 - 4.0 K/uL  0.6  0.9      There is no height or weight on file to calculate BMI.  Orders:  No orders of the defined types were placed in this encounter.  No orders of the defined types were placed in this encounter.    Procedures: No procedures performed  Clinical Data: No additional findings.  ROS:  All other systems negative, except as noted in the HPI. Review of Systems  Objective: Vital Signs: There were no vitals taken for this visit.  Specialty Comments:  No specialty comments available.  PMFS History: Patient Active Problem List   Diagnosis Date Noted   Pulmonic regurgitation 01/19/2022   Aortic insufficiency 01/19/2022   Heart palpitations 09/13/2016   Benign essential HTN 01/15/2015  Pulmonic stenosis 01/15/2015   Rheumatic fever 01/15/2015   Past Medical History:  Diagnosis Date   Aortic insufficiency    Mild by echo 8/23   Diastolic dysfunction    DJD (degenerative joint disease) of cervical spine    Osteopenia    Pulmonic regurgitation    Mild to moderate by echo 2/23   Pulmonic stenosis 01/15/2015   severe by echo 8/23 abd 07/2023 with RVH   Rheumatic fever 01/15/2015   Vitamin D deficiency     Family History  Problem Relation Age of Onset   Emphysema Mother    COPD Mother    Heart failure Father    CVA Father    Pneumonia Sister    Breast cancer Neg Hx     Past Surgical History:  Procedure Laterality Date   BREAST BIOPSY     BREAST EXCISIONAL BIOPSY Bilateral    LAPAROSCOPIC CHOLECYSTECTOMY  2009   PARTIAL HYSTERECTOMY     Social History   Occupational History   Not on file  Tobacco Use   Smoking  status: Never   Smokeless tobacco: Never  Vaping Use   Vaping status: Never Used  Substance and Sexual Activity   Alcohol use: No   Drug use: No   Sexual activity: Not on file

## 2024-08-19 ENCOUNTER — Encounter: Payer: Self-pay | Admitting: Physician Assistant

## 2024-08-19 ENCOUNTER — Ambulatory Visit: Admitting: Physician Assistant

## 2024-08-19 DIAGNOSIS — M2042 Other hammer toe(s) (acquired), left foot: Secondary | ICD-10-CM | POA: Diagnosis not present

## 2024-08-19 DIAGNOSIS — B351 Tinea unguium: Secondary | ICD-10-CM | POA: Diagnosis not present

## 2024-08-19 NOTE — Progress Notes (Signed)
 Office Visit Note   Patient: Carrie Spencer           Date of Birth: [birth year]           MRN: [id number] Visit Date: 08/19/2024              Requested by: Aisha Harvey, MD [address] [place],  [place] [address] PCP: Aisha Harvey, MD  Chief Complaint  Patient presents with   Left Foot - Follow-up      HPI: 89 y/o female with history of fifth metatarsal fracture. She has developed posterior tibial tendonitis during her recovery.  On her last visit she was going to continue to walk for exercise, get new orthotic inserts in he walking shoes and use Voltaren gel PRN.    Assessment & Plan: Visit Diagnoses:  1. Other hammer toe(s) (acquired), left foot   2. Onychomycosis     Plan: Continue to walk for exercise as tolerates. She will get new orthotic inserts and walk in her new stiff walking shoes.  PRN Voltaren gel and ice prn for the PT tendonitis.   10 toe nails.    Follow-Up Instructions: Return if symptoms worsen or fail to improve.   Ortho Exam  Patient is alert, oriented, no adenopathy, well-dressed, normal affect, normal respiratory effort. Palpable pedal pulse, no edema and no cellulitis. She is non tender over the course of the PT tendon. Resistance with good strength. Slight tenderness over the fifth MT to resistance. Full active motion of the foot and ankle.   Her nails need to be trimmed she has difficulty cutting her own nails.  10 nails trimmed.  She has rubbed a raw area on the second toehammer toe from wearing dress shoes to church.  She is wearing a corn pad to protect it.  No cellulitis or signs of infection.The wound was cleaned with Vashe.      Imaging: No results found. No images are attached to the encounter.  Labs: No results found for: HGBA1C, ESRSEDRATE, CRP, LABURIC, REPTSTATUS, GRAMSTAIN, CULT, LABORGA   Lab Results  Component Value Date   ALBUMIN 4.1 12/29/2014   ALBUMIN 3.4 (L) 09/15/2008    No results found  for: MG No results found for: VD25OH  No results found for: PREALBUMIN    Latest Ref Rng & Units 09/11/2022    2:06 PM 12/29/2014    4:19 PM 09/15/2008    2:00 PM  CBC EXTENDED  WBC 3.4 - 10.8 x10E3/uL 4.3  7.3  4.5   RBC 3.77 - 5.28 x10E6/uL 3.63  4.83  3.91   Hemoglobin 11.1 - 15.9 g/dL 89.0  85.6  88.1   HCT 34.0 - 46.6 % 33.2  43.2  35.1   Platelets 150 - 450 x10E3/uL 194  234  209   NEUT# 1.7 - 7.7 K/uL  6.4  3.1   Lymph# 0.7 - 4.0 K/uL  0.6  0.9      There is no height or weight on file to calculate BMI.  Orders:  No orders of the defined types were placed in this encounter.  No orders of the defined types were placed in this encounter.    Procedures: No procedures performed  Clinical Data: No additional findings.  ROS:  All other systems negative, except as noted in the HPI. Review of Systems  Objective: Vital Signs: There were no vitals taken for this visit.  Specialty Comments:  No specialty comments available.  PMFS History: Patient Active Problem List  Diagnosis Date Noted   Pulmonic regurgitation 01/19/2022   Aortic insufficiency 01/19/2022   Heart palpitations 09/13/2016   Benign essential HTN 01/15/2015   Pulmonic stenosis 01/15/2015   Rheumatic fever 01/15/2015   Past Medical History:  Diagnosis Date   Aortic insufficiency    Mild by echo 8/23   Diastolic dysfunction    DJD (degenerative joint disease) of cervical spine    Osteopenia    Pulmonic regurgitation    Mild to moderate by echo 2/23   Pulmonic stenosis 01/15/2015   severe by echo 8/23 abd 07/2023 with RVH   Rheumatic fever 01/15/2015   Vitamin D deficiency     Family History  Problem Relation Age of Onset   Emphysema Mother    COPD Mother    Heart failure Father    CVA Father    Pneumonia Sister    Breast cancer Neg Hx     Past Surgical History:  Procedure Laterality Date   BREAST BIOPSY     BREAST EXCISIONAL BIOPSY Bilateral    LAPAROSCOPIC CHOLECYSTECTOMY   2009   PARTIAL HYSTERECTOMY     Social History   Occupational History   Not on file  Tobacco Use   Smoking status: Never   Smokeless tobacco: Never  Vaping Use   Vaping status: Never Used  Substance and Sexual Activity   Alcohol use: No   Drug use: No   Sexual activity: Not on file

## 2024-08-22 DIAGNOSIS — H903 Sensorineural hearing loss, bilateral: Secondary | ICD-10-CM | POA: Diagnosis not present

## 2024-09-01 DIAGNOSIS — H40013 Open angle with borderline findings, low risk, bilateral: Secondary | ICD-10-CM | POA: Diagnosis not present

## 2024-09-01 DIAGNOSIS — H35361 Drusen (degenerative) of macula, right eye: Secondary | ICD-10-CM | POA: Diagnosis not present

## 2024-09-01 DIAGNOSIS — H524 Presbyopia: Secondary | ICD-10-CM | POA: Diagnosis not present

## 2024-09-01 DIAGNOSIS — H04123 Dry eye syndrome of bilateral lacrimal glands: Secondary | ICD-10-CM | POA: Diagnosis not present

## 2024-09-01 DIAGNOSIS — H353132 Nonexudative age-related macular degeneration, bilateral, intermediate dry stage: Secondary | ICD-10-CM | POA: Diagnosis not present

## 2024-09-29 ENCOUNTER — Encounter: Payer: Self-pay | Admitting: Radiology

## 2024-12-04 ENCOUNTER — Ambulatory Visit: Admitting: Orthopedic Surgery

## 2024-12-11 ENCOUNTER — Ambulatory Visit: Admitting: Orthopedic Surgery

## 2024-12-11 DIAGNOSIS — M79672 Pain in left foot: Secondary | ICD-10-CM | POA: Diagnosis not present

## 2024-12-11 DIAGNOSIS — M722 Plantar fascial fibromatosis: Secondary | ICD-10-CM

## 2024-12-15 ENCOUNTER — Encounter: Payer: Self-pay | Admitting: Orthopedic Surgery

## 2024-12-15 NOTE — Progress Notes (Signed)
 "  Office Visit Note   Patient: Carrie Spencer           Date of Birth: [birth year]           MRN: [id number] Visit Date: 12/11/2024              Requested by: Aisha Harvey, MD [address] [place],  [place] [address] PCP: Aisha Harvey, MD  Chief Complaint  Patient presents with   Right Foot - Pain   Left Foot - Pain      HPI: Discussed the use of AI scribe software for clinical note transcription with the patient, who gave verbal consent to proceed.  History of Present Illness Carrie Spencer is an 89 year old female with a healing ankle fracture who presents for follow-up of persistent bilateral foot pain.  She reports ongoing bilateral foot pain, primarily localized to the arch and plantar aspect, with aching discomfort that is most pronounced at night and in the region of her prior fracture. The pain intensifies with ambulation, occasionally necessitating cessation of walking. She has recently reduced her walking distance from five miles several times per week to two to three miles daily due to increased pain.  She has previously used orthotic inserts, which provided transient relief, but symptoms recurred. She has attempted switching inserts between shoes and finds that soaking her feet in hot water or Epsom salts offers temporary alleviation. She expresses concern about exacerbating her foot condition and remains cautious with activity.  She experiences discomfort from calluses and has a history of hammer toes and other foot deformities. She occasionally seeks pedicure services for callus management.  She continues to utilize a boot and crutches for her healing ankle fracture.     Assessment & Plan: Visit Diagnoses:  1. Pain in left foot     Plan: Assessment and Plan Assessment & Plan Plantar fasciitis Chronic plantar fasciitis with pain due to inflammation, exacerbated by ambulation and nocturnally. Pes cavus increases mechanical stress on the plantar  fascia. - Switched orthotic inserts to increase arch support. - Recommended trial of Sole Orthotics with cork and metatarsal pad if current inserts are insufficient. - Advised warm water and Epsom salt foot soaks for symptomatic relief, avoiding excessively hot water. - Advised follow-up if symptoms do not improve.  Healing ankle fracture Healing ankle fracture with progressive healing on radiographs, no arterial insufficiency or complications. - Advised continued use of boot and crutches for one additional month. - Reviewed radiographs and confirmed satisfactory healing. - Advised continuation of current activity modifications and follow-up as needed.      Follow-Up Instructions: No follow-ups on file.   Ortho Exam  Patient is alert, oriented, no adenopathy, well-dressed, normal affect, normal respiratory effort. Physical Exam CARDIOVASCULAR: Good pulses bilaterally, no arterial insufficiency. MUSCULOSKELETAL: High arch, tenderness to palpation of mid plantar fascia, no palpable defects or nodules in foot.      Imaging: No results found. No images are attached to the encounter.  Labs: No results found for: HGBA1C, ESRSEDRATE, CRP, LABURIC, REPTSTATUS, GRAMSTAIN, CULT, LABORGA   Lab Results  Component Value Date   ALBUMIN 4.1 12/29/2014   ALBUMIN 3.4 (L) 09/15/2008    No results found for: MG No results found for: VD25OH  No results found for: PREALBUMIN    Latest Ref Rng & Units 09/11/2022    2:06 PM 12/29/2014    4:19 PM 09/15/2008    2:00 PM  CBC EXTENDED  WBC 3.4 - 10.8 x10E3/uL  4.3  7.3  4.5   RBC 3.77 - 5.28 x10E6/uL 3.63  4.83  3.91   Hemoglobin 11.1 - 15.9 g/dL 89.0  85.6  88.1   HCT 34.0 - 46.6 % 33.2  43.2  35.1   Platelets 150 - 450 x10E3/uL 194  234  209   NEUT# 1.7 - 7.7 K/uL  6.4  3.1   Lymph# 0.7 - 4.0 K/uL  0.6  0.9      There is no height or weight on file to calculate BMI.  Orders:  No orders of the defined types  were placed in this encounter.  No orders of the defined types were placed in this encounter.    Procedures: No procedures performed  Clinical Data: No additional findings.  ROS:  All other systems negative, except as noted in the HPI. Review of Systems  Objective: Vital Signs: There were no vitals taken for this visit.  Specialty Comments:  No specialty comments available.  PMFS History: Patient Active Problem List   Diagnosis Date Noted   Pulmonic regurgitation 01/19/2022   Aortic insufficiency 01/19/2022   Heart palpitations 09/13/2016   Benign essential HTN 01/15/2015   Pulmonic stenosis 01/15/2015   Rheumatic fever 01/15/2015   Past Medical History:  Diagnosis Date   Aortic insufficiency    Mild by echo 8/23   Diastolic dysfunction    DJD (degenerative joint disease) of cervical spine    Osteopenia    Pulmonic regurgitation    Mild to moderate by echo 2/23   Pulmonic stenosis 01/15/2015   severe by echo 8/23 abd 07/2023 with RVH   Rheumatic fever 01/15/2015   Vitamin D deficiency     Family History  Problem Relation Age of Onset   Emphysema Mother    COPD Mother    Heart failure Father    CVA Father    Pneumonia Sister    Breast cancer Neg Hx     Past Surgical History:  Procedure Laterality Date   BREAST BIOPSY     BREAST EXCISIONAL BIOPSY Bilateral    LAPAROSCOPIC CHOLECYSTECTOMY  2009   PARTIAL HYSTERECTOMY     Social History   Occupational History   Not on file  Tobacco Use   Smoking status: Never   Smokeless tobacco: Never  Vaping Use   Vaping status: Never Used  Substance and Sexual Activity   Alcohol use: No   Drug use: No   Sexual activity: Not on file         "
